# Patient Record
Sex: Male | Born: 1955 | Race: White | Hispanic: No | Marital: Single | Smoking: Former smoker
Health system: Southern US, Community
[De-identification: ages and names within clinical notes are randomized; demographics above are authoritative.]

## PROBLEM LIST (undated history)

## (undated) DIAGNOSIS — J939 Pneumothorax, unspecified: Secondary | ICD-10-CM

## (undated) DIAGNOSIS — S0990XA Unspecified injury of head, initial encounter: Secondary | ICD-10-CM

## (undated) DIAGNOSIS — R569 Unspecified convulsions: Secondary | ICD-10-CM

## (undated) DIAGNOSIS — I739 Peripheral vascular disease, unspecified: Secondary | ICD-10-CM

## (undated) DIAGNOSIS — E872 Acidosis, unspecified: Secondary | ICD-10-CM

## (undated) DIAGNOSIS — I2510 Atherosclerotic heart disease of native coronary artery without angina pectoris: Secondary | ICD-10-CM

## (undated) DIAGNOSIS — I6389 Other cerebral infarction: Secondary | ICD-10-CM

## (undated) DIAGNOSIS — H409 Unspecified glaucoma: Secondary | ICD-10-CM

## (undated) DIAGNOSIS — I10 Essential (primary) hypertension: Secondary | ICD-10-CM

## (undated) DIAGNOSIS — J449 Chronic obstructive pulmonary disease, unspecified: Secondary | ICD-10-CM

## (undated) DIAGNOSIS — E785 Hyperlipidemia, unspecified: Secondary | ICD-10-CM

## (undated) HISTORY — PX: CARDIAC CATHETERIZATION: SHX172

## (undated) HISTORY — PX: OTHER SURGICAL HISTORY: SHX169

## (undated) HISTORY — PX: COLONOSCOPY: SHX174

## (undated) HISTORY — PX: BRAIN SURGERY: SHX531

---
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
## 2002-08-07 ENCOUNTER — Emergency Department (HOSPITAL_COMMUNITY): Admission: EM | Admit: 2002-08-07 | Discharge: 2002-08-07 | Payer: Self-pay | Admitting: Emergency Medicine

## 2005-10-17 ENCOUNTER — Ambulatory Visit: Payer: Self-pay | Admitting: Internal Medicine

## 2005-10-19 ENCOUNTER — Emergency Department: Payer: Self-pay | Admitting: General Practice

## 2009-02-08 ENCOUNTER — Ambulatory Visit: Payer: Self-pay | Admitting: Gastroenterology

## 2013-04-28 ENCOUNTER — Ambulatory Visit: Payer: Self-pay | Admitting: Gastroenterology

## 2013-04-28 DIAGNOSIS — R9431 Abnormal electrocardiogram [ECG] [EKG]: Secondary | ICD-10-CM

## 2013-04-30 LAB — PATHOLOGY REPORT

## 2013-05-29 ENCOUNTER — Ambulatory Visit: Payer: Self-pay | Admitting: Internal Medicine

## 2013-06-10 ENCOUNTER — Ambulatory Visit: Payer: Self-pay | Admitting: Internal Medicine

## 2013-10-20 ENCOUNTER — Ambulatory Visit: Payer: Self-pay | Admitting: Vascular Surgery

## 2013-10-20 LAB — CREATININE, SERUM
CREATININE: 0.92 mg/dL (ref 0.60–1.30)
EGFR (Non-African Amer.): >60

## 2013-10-20 LAB — BUN: BUN: 19 mg/dL — ABNORMAL HIGH (ref 7–18)

## 2014-11-27 NOTE — Op Note (Signed)
PATIENT NAME:  Xavier Conley, Xavier Conley MR#:  [id number] DATE OF BIRTH:  1956/01/02  DATE OF PROCEDURE:  10/20/2013  PREOPERATIVE DIAGNOSIS: Atherosclerotic occlusive disease bilateral lower extremities, with rest pain in the left lower extremity.   POSTOPERATIVE DIAGNOSIS: Atherosclerotic occlusive disease bilateral lower extremities, with rest pain in the left lower extremity.   PROCEDURES PERFORMED: 1. Abdominal aortogram.  2. Left lower extremity distal runoff.  3. Percutaneous transluminal angioplasty and stent placement, left common iliac artery.  4. Percutaneous transluminal angioplasty and stent placement, right common iliac artery.   SURGEON: Katha Cabal, M.D.   SEDATION: Versed 4 mg plus fentanyl 150 mcg administered IV. Continuous ECG, pulse oximetry and cardiopulmonary monitoring is performed throughout the entire procedure by the interventional radiology nurse. Total sedation time was one hour.   ACCESS:  1. A 6 French sheath retrograde direction in right common femoral artery.  2. A 6 French sheath retrograde direction, left common femoral artery.   FLUOROSCOPY TIME: 3.1 minutes.   CONTRAST USED: Isovue 95 mL.   INDICATIONS: Xavier Conley is a 59 year old gentleman who presents to the office with worsening pain in his left lower extremity. Physical examination as well as noninvasive studies demonstrated profound atherosclerotic occlusive disease, and he is, therefore, undergoing angiography with the hope for intervention. The risks and benefits were reviewed. All questions answered. The patient agrees to proceed.   DESCRIPTION OF PROCEDURE: The patient is taken to special procedures and placed in the supine position. After adequate sedation is achieved, both groins are prepped and draped in sterile fashion. Appropriate timeout is called.   Ultrasound is placed in a sterile sleeve. Common femoral artery on the right is identified. It is echolucent and pulsatile indicating  patency. Image is recorded for the permanent record. There does appear to be moderate plaque burden in the posterior aspect of the artery distally and the ultrasound is used to scan more proximally to select a more suitable access site. Image is recorded and the puncture is made under direct visualization. Microwire followed by microsheath, J-wire followed by a 5 French sheath a 5 French pigtail catheter. The pigtail catheter is positioned at the level of T12 and AP projection of the aorta is obtained. Pigtail catheter is repositioned to above the bifurcation and LAO projection of the pelvis is obtained. Subtotal occlusion is noted in the left common iliac, a greater than 80% occlusion is noted in the right common iliac, 4000 units of heparin is given. Ultrasound is returned to this field.   The left common femoral artery is then identified, it is echolucent and minimally pulsatile indicating it is patent, but this is also known from the angiography that has already been performed in the proximal aspect of the common femoral. The micropuncture needle is used to access the artery under direct visualization. Microwire followed by microsheath, J-wire followed by a 6 French sheath. Magic torque wire is then negotiated through the iliac lesion and into the aorta. The pigtail catheter is then used to advance a Magic torque wire up to the right side and the 5 [place] sheath is exchanged for a 6 [place] sheath.   Initially, a 7 x 39 Omnilink stent is selected, and advanced up the left side. A 7 x 40 balloon is advanced up the right, and using the kissing technique simultaneous inflation is performed. Subsequently, reinjection by hand on the right more distally in the common iliac there is that previously mentioned stenosis and this is treated with a 7 x  29 Omnilink stent.   Injection of contrast is then used to demonstrate that the distal aorta, bilateral common iliacs and external iliacs are now widely patent. There is  good apposition of the stent on the left than the right.   Using the sheath on the left hand injection of contrast is then used to demonstrate distal runoff.   Oblique views of first the left and the right groin are obtained and subsequently a StarClose devices are deployed successfully.   INTERPRETATION: The abdominal aorta is opacified with a bolus injection of contrast. There are no hemodynamically significant stenoses. The origins of the common iliac arteries are patent bilaterally, however within several millimeters significant disease is noted and there is a subtotal occlusion in the midportion of the common iliac on the left, there is an 80% stenosis in the distal portion on the right. External iliacs appear patent. Internal iliac arteries are patent, but quite small at their origins with moderate stenosis.   The left common femoral extending into the proximal SFA for at least 3 to 4 cm demonstrates coral reef-type plaque formation with a very high-grade stenosis. In the oblique view the profunda appears to be relatively spared. Distal to this lesion in the common and proximal SFA. The SFA is widely patent without evidence of hemodynamically significant stenoses. Hunter's canal is widely patent is as the above-knee popliteal and there is three-vessel runoff to the foot with the dominant being the posterior tibial.   IMPRESSION:  1. Successful angioplasty and stent placement as described above bilateral common iliac arteries.  2. Severe calcific disease within the common femoral extending into the SFA.  3. There is patency of the mid and distal SFA, as well as three-vessel runoff on the left.   ____________________________ Katha Cabal, MD ggs:sg D: 10/20/2013 11:28:46 ET T: 10/20/2013 12:47:30 ET JOB#: [id number]  cc: Katha Cabal, MD, <Dictator> Ocie Cornfield. Ouida Sills, [place] MD ELECTRONICALLY SIGNED 10/27/2013 18:48

## 2015-06-07 ENCOUNTER — Emergency Department
Admission: EM | Admit: 2015-06-07 | Discharge: 2015-06-07 | Disposition: A | Discharge status: Other Acute Inpatient Hospital | Payer: Medicare Other | Attending: Emergency Medicine | Admitting: Emergency Medicine

## 2015-06-07 ENCOUNTER — Inpatient Hospital Stay (HOSPITAL_COMMUNITY)
Admission: AD | Admit: 2015-06-07 | Discharge: 2015-06-08 | DRG: 641 | Disposition: A | Discharge status: Home / Self Care | Payer: Medicare Other | Source: Other Acute Inpatient Hospital | Attending: Neurology | Admitting: Neurology

## 2015-06-07 ENCOUNTER — Emergency Department: Payer: Medicare Other

## 2015-06-07 ENCOUNTER — Encounter: Payer: Self-pay | Admitting: Emergency Medicine

## 2015-06-07 DIAGNOSIS — I10 Essential (primary) hypertension: Secondary | ICD-10-CM | POA: Insufficient documentation

## 2015-06-07 DIAGNOSIS — Z87820 Personal history of traumatic brain injury: Secondary | ICD-10-CM

## 2015-06-07 DIAGNOSIS — Z7982 Long term (current) use of aspirin: Secondary | ICD-10-CM

## 2015-06-07 DIAGNOSIS — R269 Unspecified abnormalities of gait and mobility: Secondary | ICD-10-CM | POA: Diagnosis present

## 2015-06-07 DIAGNOSIS — E860 Dehydration: Principal | ICD-10-CM | POA: Diagnosis present

## 2015-06-07 DIAGNOSIS — R561 Post traumatic seizures: Secondary | ICD-10-CM | POA: Diagnosis not present

## 2015-06-07 DIAGNOSIS — E669 Obesity, unspecified: Secondary | ICD-10-CM | POA: Diagnosis present

## 2015-06-07 DIAGNOSIS — I619 Nontraumatic intracerebral hemorrhage, unspecified: Secondary | ICD-10-CM | POA: Insufficient documentation

## 2015-06-07 DIAGNOSIS — E785 Hyperlipidemia, unspecified: Secondary | ICD-10-CM | POA: Diagnosis present

## 2015-06-07 DIAGNOSIS — I629 Nontraumatic intracranial hemorrhage, unspecified: Secondary | ICD-10-CM

## 2015-06-07 DIAGNOSIS — Z79899 Other long term (current) drug therapy: Secondary | ICD-10-CM | POA: Diagnosis not present

## 2015-06-07 DIAGNOSIS — R270 Ataxia, unspecified: Secondary | ICD-10-CM | POA: Diagnosis not present

## 2015-06-07 DIAGNOSIS — F17200 Nicotine dependence, unspecified, uncomplicated: Secondary | ICD-10-CM | POA: Diagnosis present

## 2015-06-07 DIAGNOSIS — R42 Dizziness and giddiness: Secondary | ICD-10-CM | POA: Diagnosis present

## 2015-06-07 DIAGNOSIS — G40909 Epilepsy, unspecified, not intractable, without status epilepticus: Secondary | ICD-10-CM | POA: Diagnosis present

## 2015-06-07 DIAGNOSIS — I69398 Other sequelae of cerebral infarction: Secondary | ICD-10-CM | POA: Diagnosis not present

## 2015-06-07 DIAGNOSIS — Z6826 Body mass index (BMI) 26.0-26.9, adult: Secondary | ICD-10-CM | POA: Diagnosis not present

## 2015-06-07 DIAGNOSIS — M79602 Pain in left arm: Secondary | ICD-10-CM | POA: Diagnosis not present

## 2015-06-07 DIAGNOSIS — I611 Nontraumatic intracerebral hemorrhage in hemisphere, cortical: Secondary | ICD-10-CM | POA: Diagnosis not present

## 2015-06-07 DIAGNOSIS — E119 Type 2 diabetes mellitus without complications: Secondary | ICD-10-CM | POA: Diagnosis not present

## 2015-06-07 HISTORY — DX: Unspecified convulsions: R56.9

## 2015-06-07 HISTORY — DX: Essential (primary) hypertension: I10

## 2015-06-07 LAB — URINALYSIS COMPLETE WITH MICROSCOPIC (ARMC ONLY)
BILIRUBIN URINE: NEGATIVE
GLUCOSE, UA: NEGATIVE mg/dL
HGB URINE DIPSTICK: NEGATIVE
Ketones, ur: NEGATIVE mg/dL
LEUKOCYTES UA: NEGATIVE
Nitrite: NEGATIVE
Protein, ur: NEGATIVE mg/dL
SPECIFIC GRAVITY, URINE: 1.009 (ref 1.005–1.030)
Squamous Epithelial / LPF: NONE SEEN
WBC, UA: NONE SEEN WBC/hpf (ref 0–5)
pH: 8 (ref 5.0–8.0)

## 2015-06-07 LAB — BASIC METABOLIC PANEL
Anion gap: 6 (ref 5–15)
BUN: 10 mg/dL (ref 6–20)
CALCIUM: 8.7 mg/dL — AB (ref 8.9–10.3)
CO2: 27 mmol/L (ref 22–32)
Chloride: 105 mmol/L (ref 101–111)
Creatinine, Ser: 0.8 mg/dL (ref 0.61–1.24)
GFR calc non Af Amer: >60 mL/min (ref >60)
Glucose, Bld: 95 mg/dL (ref 65–99)
Potassium: 3.9 mmol/L (ref 3.5–5.1)
Sodium: 138 mmol/L (ref 135–145)

## 2015-06-07 LAB — CBC
HCT: 39.3 % — ABNORMAL LOW (ref 40.0–52.0)
Hemoglobin: 13.4 g/dL (ref 13.0–18.0)
MCH: 31.3 pg (ref 26.0–34.0)
MCHC: 34.2 g/dL (ref 32.0–36.0)
MCV: 91.8 fL (ref 80.0–100.0)
PLATELETS: 189 10*3/uL (ref 150–440)
RBC: 4.28 MIL/uL — AB (ref 4.40–5.90)
RDW: 15.2 % — AB (ref 11.5–14.5)
WBC: 5.8 10*3/uL (ref 3.8–10.6)

## 2015-06-07 LAB — TROPONIN I: TROPONIN I: 0.03 ng/mL (ref <0.031)

## 2015-06-07 LAB — CARBAMAZEPINE LEVEL, TOTAL: CARBAMAZEPINE LVL: 10.6 ug/mL (ref 4.0–12.0)

## 2015-06-07 MED ORDER — SODIUM CHLORIDE 0.9 % IV SOLN
1000.0000 mL | Freq: Once | INTRAVENOUS | Status: AC
  Administered 2015-06-07: 1000 mL via INTRAVENOUS

## 2015-06-07 MED ORDER — ACETAMINOPHEN 650 MG RE SUPP: 650.0000 mg | RECTAL | Status: DC | PRN

## 2015-06-07 MED ORDER — ACETAMINOPHEN 325 MG PO TABS: 650.0000 mg | ORAL_TABLET | ORAL | Status: DC | PRN

## 2015-06-07 MED ORDER — LABETALOL HCL 5 MG/ML IV SOLN
10.0000 mg | INTRAVENOUS | Status: DC | PRN
  Administered 2015-06-08: 20 mg via INTRAVENOUS
  Filled 2015-06-07: qty 4

## 2015-06-07 MED ORDER — PANTOPRAZOLE SODIUM 40 MG IV SOLR
40.0000 mg | Freq: Every day | INTRAVENOUS | Status: DC
  Administered 2015-06-08: 40 mg via INTRAVENOUS
  Filled 2015-06-07: qty 40

## 2015-06-07 MED ORDER — STROKE: EARLY STAGES OF RECOVERY BOOK
Freq: Once | Status: AC
  Administered 2015-06-08: 03:00:00
  Filled 2015-06-07: qty 1

## 2015-06-07 MED ORDER — SENNOSIDES-DOCUSATE SODIUM 8.6-50 MG PO TABS
1.0000 | ORAL_TABLET | Freq: Two times a day (BID) | ORAL | Status: DC
  Filled 2015-06-07: qty 1

## 2015-06-07 NOTE — ED Notes (Signed)
Lab called at this time to add on troponin and carbamazepine. Lab will add on.

## 2015-06-07 NOTE — ED Notes (Signed)
Patient transported to CT 

## 2015-06-07 NOTE — ED Notes (Signed)
Pt to ed with c/o left arm pain x several weeks and acute onset of dizziness that started today.  Pt denies sob, denies chest pain.

## 2015-06-07 NOTE — H&P (Addendum)
Stroke Consult Consulting Physician: Dr Jimmye Norman ED [facility]  Chief Complaint: ICH  HPI: Xavier Conley is an 59 y.o. male hx of HTN, TBI, seizures presenting to [facility] with acute onset of balance disturbance. Describes sensation of dizziness and light headed sensation upon standing. Reports feeling like he is falling to the left. Denies vertigo. Notes some improvement with sitting down. Denies any weakness, sensory, speech or visual deficits. No prior CVA or TIA history. Notes symptoms improved after receiving IV hydration in the ED. Tegretol level of 10 in the ED.   CT head imaging reviewed, shows encephalomalacia in left temporal lobe along with question of acute hemorrhage along the posterior periphery of the left temporal lobe and inferior most aspect of the left occipital lobe.   Date last known well: 11/01 Time last known well: 1800 tPA Given: no, ICH Modified Rankin: Rankin Score=0  ICH score of 0  Past Medical History  Diagnosis Date  . Hypertension   . Seizures ([place])     No past surgical history on file.  No family history on file. Social History:  reports that he has been smoking.  He does not have any smokeless tobacco history on file. He reports that he drinks alcohol. He reports that he does not use illicit drugs.  Allergies: No Known Allergies  Medications Prior to Admission  Medication Sig Dispense Refill  . acetaZOLAMIDE (DIAMOX) 250 MG tablet Take 250 mg by mouth 2 (two) times daily.    Marland Kitchen albuterol (PROVENTIL HFA;VENTOLIN HFA) 108 (90 BASE) MCG/ACT inhaler Inhale 2 puffs into the lungs every 4 (four) hours as needed for wheezing or shortness of breath.    Marland Kitchen amLODipine (NORVASC) 5 MG tablet Take 5 mg by mouth daily.    Marland Kitchen aspirin EC 81 MG tablet Take 81 mg by mouth daily.    Marland Kitchen atorvastatin (LIPITOR) 20 MG tablet Take 20 mg by mouth daily.     . carbamazepine (TEGRETOL XR) 200 MG 12 hr tablet Take 200 mg by mouth 2 (two) times daily. Pt takes with a 400mg  tablet.    .  carbamazepine (TEGRETOL XR) 400 MG 12 hr tablet Take 400 mg by mouth 2 (two) times daily. Pt takes with a 200mg  tablet.    . clopidogrel (PLAVIX) 75 MG tablet Take 75 mg by mouth daily.    . isosorbide mononitrate (IMDUR) 30 MG 24 hr tablet Take 30 mg by mouth daily.    Marland Kitchen losartan (COZAAR) 100 MG tablet Take 100 mg by mouth daily.      ROS: Out of a complete 14 system review, the patient complains of only the following symptoms, and all other reviewed systems are negative. +dizziness  Physical Examination: Filed Vitals:   06/07/15 2300  BP: 150/86  Pulse: 67  Temp:   Resp: 16   Physical Exam  Constitutional: He appears well-developed and well-nourished.  Psych: Affect appropriate to situation Eyes: No scleral injection HENT: No OP obstrucion Head: Normocephalic.  Cardiovascular: Normal rate and regular rhythm.  Respiratory: Effort normal and breath sounds normal.  GI: Soft. Bowel sounds are normal. No distension. There is no tenderness.  Skin: WDI  Neurologic Examination: Mental Status: Alert, oriented, thought content appropriate.  Speech fluent without evidence of aphasia.  Able to follow 3 step commands without difficulty. Cranial Nerves: II: funduscopic exam wnl bilaterally, right inferior VF deficit, pupils equal, round, reactive to light and accommodation III,IV, VI: ptosis not present, extra-ocular motions intact bilaterally V,VII: smile symmetric, facial light touch  sensation normal bilaterally VIII: hearing normal bilaterally IX,X: gag reflex present XI: trapezius strength/neck flexion strength normal bilaterally XII: tongue strength normal  Motor: Right : Upper extremity    Left:     Upper extremity 5/5 deltoid       5/5 deltoid 5/5 biceps      5/5 biceps  5/5 triceps      5/5 triceps 5/5 hand grip      5/5 hand grip  Lower extremity     Lower extremity 5/5 hip flexor      5/5 hip flexor 5/5 quadricep      5/5 quadriceps  5/5 hamstrings     5/5  hamstrings 5/5 plantar flexion       5/5 plantar flexion 5/5 plantar extension     5/5 plantar extension Tone and bulk:normal tone throughout; no atrophy noted Sensory: Pinprick and light touch intact throughout, bilaterally Deep Tendon Reflexes: 2+ and symmetric throughout Plantars: Right: downgoing   Left: downgoing Cerebellar: normal finger-to-nose, and normal heel-to-shin test Gait: deferred  Laboratory Studies:   Basic Metabolic Panel:  Recent Labs Lab 06/07/15 1812  NA 138  K 3.9  CL 105  CO2 27  GLUCOSE 95  BUN 10  CREATININE 0.80  CALCIUM 8.7*    Liver Function Tests: No results for input(s): AST, ALT, ALKPHOS, BILITOT, PROT, ALBUMIN in the last 168 hours. No results for input(s): LIPASE, AMYLASE in the last 168 hours. No results for input(s): AMMONIA in the last 168 hours.  CBC:  Recent Labs Lab 06/07/15 1812  WBC 5.8  HGB 13.4  HCT 39.3*  MCV 91.8  PLT 189    Cardiac Enzymes:  Recent Labs Lab 06/07/15 1812  TROPONINI 0.03    BNP: Invalid input(s): POCBNP  CBG: No results for input(s): GLUCAP in the last 168 hours.  Microbiology: No results found for this or any previous visit.  Coagulation Studies: No results for input(s): LABPROT, INR in the last 72 hours.  Urinalysis:  Recent Labs Lab 06/07/15 1812  COLORURINE YELLOW*  LABSPEC 1.009  PHURINE 8.0  GLUCOSEU NEGATIVE  HGBUR NEGATIVE  BILIRUBINUR NEGATIVE  KETONESUR NEGATIVE  PROTEINUR NEGATIVE  NITRITE NEGATIVE  LEUKOCYTESUR NEGATIVE    Lipid Panel:  No results found for: CHOL, TRIG, HDL, CHOLHDL, VLDL, LDLCALC  HgbA1C: No results found for: HGBA1C  Urine Drug Screen:  No results found for: LABOPIA, COCAINSCRNUR, LABBENZ, AMPHETMU, THCU, LABBARB  Alcohol Level: No results for input(s): ETH in the last 168 hours.  Other results:  Imaging: Ct Head Wo Contrast  06/07/2015  CLINICAL DATA:  Acute onset dizziness EXAM: CT HEAD WITHOUT CONTRAST TECHNIQUE: Contiguous axial  images were obtained from the base of the skull through the vertex without intravenous contrast. COMPARISON:  Brain MRI October 17, 2005 FINDINGS: There is mild diffuse atrophy. Enlargement of the atrium the left lateral ventricle is due to encephalomalacia. There is evidence of a prior infarct in the left temporal lobe. Currently there is hemorrhage along the periphery of the posterior left temporal lobe and inferior most aspect of the left occipital lobe extending to the level of the tentorium. No other hemorrhage is seen. There is no mass, midline shift, or extra-axial fluid. There is mild patchy periventricular small vessel disease in the centra semiovale bilaterally. There is a posttraumatic bony defect in the left temporal lobe, stable. Bony calvarium otherwise appears intact. The mastoid air cells are clear. There is extensive debris in each external auditory canal. 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 Enlargement of the atrium of the left lateral ventricle is due to this encephalomalacia. There is evidence of acute hemorrhage along the posterior periphery of the left temporal lobe and inferior most aspect of the left occipital lobe. This hemorrhage may be due to infarction in this area but also could represent focal amyloid angiopathy. Elsewhere there is atrophy with mild periventricular small vessel disease. No midline shift. No subdural or epidural fluid collections. There is probable cerumen in both external auditory canals. Critical Value/emergent results were called by telephone at the time of interpretation on 06/07/2015 at 7:20 pm to Dr. Lenise Arena , who verbally acknowledged these results. Electronically Signed   By: Lowella Grip III M.D.   On: 06/07/2015 19:20    Assessment: 59 y.o. male hx of seizures, TBI, HTN presenting to [facility] ED with acute onset of gait instability described as a dizzy/light-headed sensation upon standing. CT head in ED shows question  of small ICH in addition to prior left temporal encephalomalacia. ED request transfer to [facility] for further evaluation. Of note, patient reports his symptoms improved after receiving IV hydration, question presenting symptoms related to orthostatic changes.    Plan: 1) Admit to ICU. Likely transfer out in the morning 2)MRI brain without contrast 3) no antiplatelets or anticoagulants 4) blood pressure control with goal systolic <924 5) Frequent neuro checks 6) If symptoms worsen or there is decreased mental status, repeat stat head CT    This patient is critically ill and at significant risk of neurological worsening, death and care requires constant monitoring of vital signs, hemodynamics,respiratory and cardiac monitoring,review of multiple databases, neurological assessment, discussion with family, other specialists and medical decision making of high complexity. I spent 35 inutes of neurocritical care time in the care of this patient.    Jim Like, DO [facility] [phone]  If 7pm- 7am, please page neurology on call as listed in [place]. 06/07/2015, 11:30 PM

## 2015-06-07 NOTE — ED Notes (Signed)
MD at bedside. 

## 2015-06-07 NOTE — ED Notes (Signed)
Pt given ice chips per verbal okay from MD Jimmye Norman, stroke swallow screen completed and passed. Pt sitting up in bed, eating ice chips, tolerating well, no acute distress noted. Pt and family verbalized no further needs at this time

## 2015-06-07 NOTE — ED Provider Notes (Signed)
[facility] Emergency Department Provider Note     Time seen: ----------------------------------------- 6:42 PM on 06/07/2015 -----------------------------------------    I have reviewed the triage vital signs and the nursing notes.   HISTORY  Chief Complaint Dizziness    HPI Xavier RIESGO is a 59 y.o. male who presents with acute onset balance disturbance around an hour and a half ago. Patient states she's had some left arm pain for several weeks but today get up and watching TV and was falling to his left. He describes this as dizziness but did not have room spinning sensation, denies any fevers, chills, chest pain or shortness of breath. Doesn't history of seizures and has been taking his medications.   Past Medical History  Diagnosis Date  . Hypertension   . Seizures ([place])     There are no active problems to display for this patient.   History reviewed. No pertinent past surgical history.  Allergies Review of patient's allergies indicates no known allergies.  Social History Social History  Substance Use Topics  . Smoking status: Current Every Day Smoker  . Smokeless tobacco: None  . Alcohol Use: Yes    Review of Systems Constitutional: Negative for fever. Eyes: Negative for visual changes. ENT: Negative for sore throat. Cardiovascular: Negative for chest pain. Respiratory: Negative for shortness of breath. Gastrointestinal: Negative for abdominal pain, vomiting and diarrhea. Genitourinary: Negative for dysuria. Musculoskeletal: Positive for left arm pain Skin: Negative for rash. Neurological: Negative for headaches, positive for balance disturbance  10-point ROS otherwise negative.  ____________________________________________   PHYSICAL EXAM:  VITAL SIGNS: ED Triage Vitals  Enc Vitals Group     BP 06/07/15 1808 159/79 mmHg     Pulse Rate 06/07/15 1808 65     Resp 06/07/15 1808 20     Temp 06/07/15 1808 97.9 F  (36.6 C)     Temp Source 06/07/15 1808 Oral     SpO2 06/07/15 1808 97 %     Weight 06/07/15 1808 170 lb (77.111 kg)     Height 06/07/15 1808 5\' 8"  (1.727 m)     Head Cir --      Peak Flow --      Pain Score 06/07/15 1809 4     Pain Loc --      Pain Edu? --      Excl. in [place]? --     Constitutional: Alert and oriented. Well appearing and in no distress. Eyes: Conjunctivae are normal. PERRL. Normal extraocular movements. ENT   Head: Normocephalic and atraumatic.   Nose: No congestion/rhinnorhea.   Mouth/Throat: Mucous membranes are moist.   Neck: No stridor. Cardiovascular: Normal rate, regular rhythm. Normal and symmetric distal pulses are present in all extremities. No murmurs, rubs, or gallops. Respiratory: Normal respiratory effort without tachypnea nor retractions. Breath sounds are clear and equal bilaterally. No wheezes/rales/rhonchi. Gastrointestinal: Soft and nontender. No distention. No abdominal bruits.  Musculoskeletal: Nontender with normal range of motion in all extremities. No joint effusions.  No lower extremity tenderness nor edema. Neurologic:  Normal speech and language. Patient with normal strength and sensation. Finger to nose is slightly imprecise. I do not appreciate balance disturbance when I walked in. Negative Romberg examination. Skin:  Skin is warm, dry and intact. No rash noted. Psychiatric: Mood and affect are normal. Speech and behavior are normal. Patient exhibits appropriate insight and judgment. ____________________________________________  EKG: Interpreted by me. Normal sinus rhythm with a rate of 65 bpm, LVH with repolarization abnormality, normal  PR interval, wide QRS, normal QT interval.  ____________________________________________  ED COURSE:  Pertinent labs & imaging results that were available during my care of the patient were reviewed by me and considered in my medical decision making (see chart for details). Patient with acute  onset ataxia. We'll obtain head CT and basic labs. ____________________________________________    LABS (pertinent positives/negatives)  Labs Reviewed  BASIC METABOLIC PANEL - Abnormal; Notable for the following:    Calcium 8.7 (*)    All other components within normal limits  CBC - Abnormal; Notable for the following:    RBC 4.28 (*)    HCT 39.3 (*)    RDW 15.2 (*)    All other components within normal limits  URINALYSIS COMPLETEWITH MICROSCOPIC ([facility] ONLY) - Abnormal; Notable for the following:    Color, Urine YELLOW (*)    APPearance CLOUDY (*)    Bacteria, UA RARE (*)    All other components within normal limits  TROPONIN I   CRITICAL CARE Performed by: Earleen Newport   Total critical care time: 30 minutes  Critical care time was exclusive of separately billable procedures and treating other patients.  Critical care was necessary to treat or prevent imminent or life-threatening deterioration.  Critical care was time spent personally by me on the following activities: development of treatment plan with patient and/or surrogate as well as nursing, discussions with consultants, evaluation of patient's response to treatment, examination of patient, obtaining history from patient or surrogate, ordering and performing treatments and interventions, ordering and review of laboratory studies, ordering and review of radiographic studies, pulse oximetry and re-evaluation of patient's condition.   RADIOLOGY Images were viewed by me  CT head IMPRESSION: Encephalomalacia with prior infarct left temporal lobe. Enlargement of the atrium of the left lateral ventricle is due to this encephalomalacia. There is evidence of acute hemorrhage along the posterior periphery of the left temporal lobe and inferior most aspect of the left occipital lobe. This hemorrhage may be due to infarction in this area but also could represent focal amyloid angiopathy. Elsewhere there is atrophy  with mild periventricular small vessel disease. No midline shift. No subdural or epidural fluid collections.  There is probable cerumen in both external auditory canals.  Critical Value/emergent results were called by telephone at the time of interpretation on 06/07/2015 at 7:20 pm to Dr. Lenise Arena , who verbally acknowledged these results. ____________________________________________  FINAL ASSESSMENT AND PLAN  Ataxia, acute posterior intracranial hemorrhage  Plan: Patient with labs and imaging as dictated above. Patient is in no acute distress, CT reveals acute posterior hemorrhage. Will discuss with the neuro hospitalist at [facility] and prepare for transfer.  Patient's been accepted and transferred to [facility]. Accepting doctor is Dr. Janann Colonel. He is currently medically stable. Earleen Newport, MD   Earleen Newport, MD 06/07/15 [phone]

## 2015-06-08 ENCOUNTER — Inpatient Hospital Stay (HOSPITAL_COMMUNITY): Payer: Medicare Other

## 2015-06-08 ENCOUNTER — Encounter (HOSPITAL_COMMUNITY): Payer: Self-pay | Admitting: *Deleted

## 2015-06-08 DIAGNOSIS — I69398 Other sequelae of cerebral infarction: Secondary | ICD-10-CM

## 2015-06-08 DIAGNOSIS — R561 Post traumatic seizures: Secondary | ICD-10-CM

## 2015-06-08 LAB — MRSA PCR SCREENING: MRSA BY PCR: NEGATIVE

## 2015-06-08 MED ORDER — ALBUTEROL SULFATE (2.5 MG/3ML) 0.083% IN NEBU: 3.0000 mL | INHALATION_SOLUTION | RESPIRATORY_TRACT | Status: DC | PRN

## 2015-06-08 MED ORDER — ISOSORBIDE MONONITRATE ER 30 MG PO TB24
30.0000 mg | ORAL_TABLET | Freq: Every day | ORAL | Status: DC
  Filled 2015-06-08: qty 1

## 2015-06-08 MED ORDER — ATORVASTATIN CALCIUM 20 MG PO TABS
20.0000 mg | ORAL_TABLET | Freq: Every day | ORAL | Status: DC
Start: 2015-06-08 — End: 2015-06-08

## 2015-06-08 MED ORDER — CARBAMAZEPINE ER 400 MG PO TB12: 400.0000 mg | ORAL_TABLET | Freq: Two times a day (BID) | ORAL | Status: DC

## 2015-06-08 MED ORDER — ASPIRIN EC 81 MG PO TBEC
81.0000 mg | DELAYED_RELEASE_TABLET | Freq: Every day | ORAL | Status: DC
Start: 2015-06-08 — End: 2015-06-08

## 2015-06-08 MED ORDER — AMLODIPINE BESYLATE 5 MG PO TABS: 5.0000 mg | ORAL_TABLET | Freq: Every day | ORAL | Status: DC

## 2015-06-08 MED ORDER — CLOPIDOGREL BISULFATE 75 MG PO TABS: 75.0000 mg | ORAL_TABLET | Freq: Every day | ORAL | Status: DC

## 2015-06-08 MED ORDER — LOSARTAN POTASSIUM 50 MG PO TABS: 100.0000 mg | ORAL_TABLET | Freq: Every day | ORAL | Status: DC

## 2015-06-08 MED ORDER — INFLUENZA VAC SPLIT QUAD 0.5 ML IM SUSY: 0.5000 mL | PREFILLED_SYRINGE | INTRAMUSCULAR | Status: DC

## 2015-06-08 MED ORDER — ACETAZOLAMIDE 250 MG PO TABS
250.0000 mg | ORAL_TABLET | Freq: Two times a day (BID) | ORAL | Status: DC
  Filled 2015-06-08: qty 1

## 2015-06-08 MED ORDER — CARBAMAZEPINE ER 200 MG PO TB12
600.0000 mg | ORAL_TABLET | Freq: Two times a day (BID) | ORAL | Status: DC
  Filled 2015-06-08: qty 3

## 2015-06-08 NOTE — Progress Notes (Signed)
PT Cancellation Note  Patient Details Name: Xavier Conley MRN: [id number] DOB: 08/19/1955   Cancelled Treatment:    Reason Eval/Treat Not Completed: Patient not medically ready.  Pt currently with active bedrest order.  Please advance activity order when appropriate for PT and mobility.     Pharell Rolfson, Thornton Papas 06/08/2015, 10:02 AM

## 2015-06-08 NOTE — Discharge Instructions (Signed)
Please follow up w/ Primary Care Physician on discharge within 1-2 weeks.

## 2015-06-08 NOTE — Evaluation (Signed)
Physical Therapy Evaluation Patient Details Name: Xavier Conley MRN: [id number] DOB: 10/23/1955 Today's Date: 06/08/2015   History of Present Illness  pt presents with Gait Difficulties, Dizziness, and balance difficulties.  pt with hx of TBI, HTN, and Seizures.    Clinical Impression  Pt seems to have returned to baseline level of function.  Pt able to ambulate and negotiate around and over obstacles without difficulties.  No further acute PT needs at this time, will sign off.      Follow Up Recommendations No PT follow up;Supervision - Intermittent    Equipment Recommendations  None recommended by PT    Recommendations for Other Services       Precautions / Restrictions Precautions Precautions: None Restrictions Weight Bearing Restrictions: No      Mobility  Bed Mobility Overal bed mobility: Modified Independent             General bed mobility comments: Increased time, but no deficits noted.    Transfers Overall transfer level: Modified independent Equipment used: None             General transfer comment: Use of UEs, but no A needed.    Ambulation/Gait Ambulation/Gait assistance: Independent Ambulation Distance (Feet): 200 Feet Assistive device: None Gait Pattern/deviations: WFL(Within Functional Limits)     General Gait Details: pt able to negotiate around and over obstacles without deficit.    Stairs            Wheelchair Mobility    Modified Rankin (Stroke Patients Only)       Balance Overall balance assessment: No apparent balance deficits (not formally assessed)                                           Pertinent Vitals/Pain Pain Assessment: No/denies pain    Home Living Family/patient expects to be discharged to:: Private residence Living Arrangements: Alone Available Help at Discharge: Family;Available 24 hours/day (Family to stay with pt initially.) Type of Home: House Home Access: Stairs to  enter Entrance Stairs-Rails: None Entrance Stairs-Number of Steps: 1 Home Layout: One level Home Equipment: None      Prior Function Level of Independence: Independent               Hand Dominance        Extremity/Trunk Assessment   Upper Extremity Assessment: Overall WFL for tasks assessed           Lower Extremity Assessment: Overall WFL for tasks assessed      Cervical / Trunk Assessment: Normal  Communication   Communication: No difficulties  Cognition Arousal/Alertness: Awake/alert Behavior During Therapy: WFL for tasks assessed/performed Overall Cognitive Status: History of cognitive impairments - at baseline                      General Comments      Exercises        Assessment/Plan    PT Assessment Patent does not need any further PT services  PT Diagnosis Difficulty walking   PT Problem List    PT Treatment Interventions     PT Goals (Current goals can be found in the Care Plan section) Acute Rehab PT Goals Patient Stated Goal: Home PT Goal Formulation: All assessment and education complete, DC therapy    Frequency     Barriers to discharge  Co-evaluation               End of Session Equipment Utilized During Treatment: Gait belt Activity Tolerance: Patient tolerated treatment well Patient left: in bed;with call bell/phone within reach Nurse Communication: Mobility status         Time: 1023-1040 PT Time Calculation (min) (ACUTE ONLY): 17 min   Charges:   PT Evaluation $Initial PT Evaluation Tier I: 1 Procedure     PT G CodesCatarina Conley, [place] 06/08/2015, 11:31 AM

## 2015-06-08 NOTE — Evaluation (Signed)
Speech Language Pathology Evaluation Patient Details Name: Xavier Conley MRN: [id number] DOB: Mar 06, 1956 Today's Date: 06/08/2015 Time: 2094-7096 SLP Time Calculation (min) (ACUTE ONLY): 16 min  Problem List:  Patient Active Problem List   Diagnosis Date Noted  . ICH (intracerebral hemorrhage) ([place]) 06/07/2015   Past Medical History:  Past Medical History  Diagnosis Date  . Hypertension   . Seizures ([place])    Past Surgical History: History reviewed. No pertinent past surgical history. HPI:  59 y.o. male hx of seizures, TBI, HTN presenting to [facility] ED with acute onset of gait instability described as a dizzy/light-headed sensation upon standing. CT head in ED shows question of small ICH in addition to prior left temporal encephalomalacia. ED request transfer to [facility] for further evaluation. Of note, patient reports his symptoms improved after receiving IV hydration, question presenting symptoms related to orthostatic changes.    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  He compensates well for word-retrieval deficits; describes difficulty with writing, but manages his home and finances independently.  No SLP f/u needed - will sign off.     SLP Assessment  Patient does not need any further Speech Lanaguage Pathology Services    Follow Up Recommendations  None       Pertinent Vitals/Pain Pain Assessment: No/denies pain   SLP Goals     SLP Evaluation Prior Functioning  Cognitive/Linguistic Baseline: Baseline deficits Baseline deficit details: aphasia Type of Home: House  Lives With: Alone Available Help at Discharge: Family;Available 24 hours/day   Cognition  Overall Cognitive Status: History of cognitive impairments - at baseline Arousal/Alertness: Awake/alert Orientation Level: Oriented X4 Attention: Sustained Sustained Attention: Appears  intact Awareness: Appears intact    Comprehension  Auditory Comprehension Overall Auditory Comprehension: Impaired at baseline Visual Recognition/Discrimination Discrimination: Exceptions to [facility]    Expression Expression Primary Mode of Expression: Verbal Verbal Expression Overall Verbal Expression: Impaired at baseline Initiation: No impairment Level of Generative/Spontaneous Verbalization: Conversation Repetition: No impairment Naming: Impairment Responsive: 26-50% accurate Confrontation: Impaired Convergent: 75-100% accurate Divergent: 0-24% accurate Verbal Errors: Semantic paraphasias Pragmatics: No impairment Written Expression Dominant Hand: Right Written Expression: Exceptions to [facility] Self Formulation Ability: Sentence;Word   Oral / Motor Oral Motor/Sensory Function Overall Oral Motor/Sensory Function: Appears within functional limits for tasks assessed Motor Speech Overall Motor Speech: Appears within functional limits for tasks assessed   Brenley Priore L. Tivis Ringer, [place] CCC/SLP Pager [phone]      Juan Quam Laurice 06/08/2015, 1:10 PM

## 2015-06-08 NOTE — Discharge Summary (Signed)
Stroke Discharge Summary  Patient ID: Xavier Conley   MRN: [id number]      DOB: [birth year]  Date of Admission: 06/07/2015 Date of Discharge: 06/09/2015  Attending Physician:  No att. providers found, Stroke MD  Consulting Physician(s):     None  Patient's PCP:  No primary care provider on file.  DISCHARGE DIAGNOSIS: Active Problems:   Dizziness 2/2 orthostatic hypotension. 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  Past Medical History  Diagnosis Date  . Hypertension   . Seizures ([place])    History reviewed. No pertinent past surgical history.    Medication List    TAKE these medications        acetaZOLAMIDE 250 MG tablet  Commonly known as:  DIAMOX  Take 250 mg by mouth 2 (two) times daily.     albuterol 108 (90 BASE) MCG/ACT inhaler  Commonly known as:  PROVENTIL HFA;VENTOLIN HFA  Inhale 2 puffs into the lungs every 4 (four) hours as needed for wheezing or shortness of breath.     amLODipine 5 MG tablet  Commonly known as:  NORVASC  Take 5 mg by mouth daily.     aspirin EC 81 MG tablet  Take 81 mg by mouth daily.     atorvastatin 20 MG tablet  Commonly known as:  LIPITOR  Take 20 mg by mouth daily.     carbamazepine 200 MG 12 hr tablet  Commonly known as:  TEGRETOL XR  Take 200 mg by mouth 2 (two) times daily. Pt takes with a 400mg  tablet.     carbamazepine 400 MG 12 hr tablet  Commonly known as:  TEGRETOL XR  Take 400 mg by mouth 2 (two) times daily. Pt takes with a 200mg  tablet.     clopidogrel 75 MG tablet  Commonly known as:  PLAVIX  Take 75 mg by mouth daily.     isosorbide mononitrate 30 MG 24 hr tablet  Commonly known as:  IMDUR  Take 30 mg by mouth daily.     losartan 100 MG tablet  Commonly known as:   COZAAR  Take 100 mg by mouth daily.        LABORATORY STUDIES CBC    Component Value Date/Time   WBC 5.8 06/07/2015 1812   RBC 4.28* 06/07/2015 1812   HGB 13.4 06/07/2015 1812   HCT 39.3* 06/07/2015 1812   PLT 189 06/07/2015 1812   MCV 91.8 06/07/2015 1812   MCH 31.3 06/07/2015 1812   MCHC 34.2 06/07/2015 1812   RDW 15.2* 06/07/2015 1812   CMP    Component Value Date/Time   NA 138 06/07/2015 1812   K 3.9 06/07/2015 1812   CL 105 06/07/2015 1812   CO2 27 06/07/2015 1812   GLUCOSE 95 06/07/2015 1812   BUN 10 06/07/2015 1812   BUN 19* 10/20/2013 0805   CREATININE 0.80 06/07/2015 1812   CREATININE 0.92 10/20/2013 0805   CALCIUM 8.7* 06/07/2015 1812   GFRNONAA >60 06/07/2015 1812   GFRNONAA >60 10/20/2013 0805   GFRAA >60 06/07/2015 1812   GFRAA >60 10/20/2013 0805   Cardiac Panel (last 3 results)   Recent Labs  06/07/15 1812  TROPONINI 0.03   Urinalysis    Component Value Date/Time  COLORURINE YELLOW* 06/07/2015 1812   APPEARANCEUR CLOUDY* 06/07/2015 1812   LABSPEC 1.009 06/07/2015 1812   PHURINE 8.0 06/07/2015 1812   GLUCOSEU NEGATIVE 06/07/2015 1812   HGBUR NEGATIVE 06/07/2015 1812   BILIRUBINUR NEGATIVE 06/07/2015 1812   KETONESUR NEGATIVE 06/07/2015 1812   PROTEINUR NEGATIVE 06/07/2015 1812   NITRITE NEGATIVE 06/07/2015 1812   LEUKOCYTESUR NEGATIVE 06/07/2015 1812    SIGNIFICANT DIAGNOSTIC STUDIES  CT head: Shows encephalomalacia in left temporal lobe along with question of acute hemorrhage along the posterior periphery of the left temporal lobe and inferior most aspect of the left occipital lobe.   MRI: No acute intracranial process, no MR findings of acute hemorrhage. Extensive LEFT temporal occipital parietal encephalomalacia. Mineralization LEFT temporal occipital lobes corresponding to CT finding.    HISTORY OF PRESENT ILLNESS Xavier Conley is an 59 y.o. male hx of HTN, TBI, seizures presenting to [facility] with acute onset of balance  disturbance. Describes sensation of dizziness and light headed sensation upon standing. Reports feeling like he is falling to the left. Denies vertigo. Notes some improvement with sitting down. Denies any weakness, sensory, speech or visual deficits. No prior CVA or TIA history. Notes symptoms improved after receiving IV hydration in the ED. Tegretol level of 10 in the ED.   HOSPITAL COURSE Patient w/ previous h/o TBI and encephalomalacia, presented to [facility] with acute onset of balance disturbance. He described the sensation as dizziness and lightheadedness upon standing and feeling like he was falling to the left. No weakness, sensory, speech, or visual deficits. No prior h/o CVA or TIA. Symptoms improved w/ IV hydration in ED. CT head in ED showed encephalomalacia w/ prior left temporal lobe infarct. Also suggestive of a questionable acute hemorrhage along posterior periphery of left temporal lobe and inferior aspect of the occipital lobe. No midline shift. Confirmatory MRI showed no hemorrhage or acute abnormality.    DISCHARGE EXAM Blood pressure 162/83, pulse 75, temperature 97.5 F (36.4 C), temperature source Oral, resp. rate 19, height 5\' 8"  (1.727 m), weight 172 lb 13.5 oz (78.4 kg), SpO2 98 %.   General: Obese white male, alert, cooperative, NAD. HEENT: PERRL, EOMI. Moist mucus membranes Neck: Full range of motion without pain, supple, no lymphadenopathy or carotid bruits Lungs: Clear to ascultation bilaterally, normal work of respiration, no wheezes, rales, rhonchi Heart: RRR, no murmurs, gallops, or rubs Abdomen: Soft, non-tender, non-distended, BS + Extremities: No cyanosis, clubbing, or edema  Mental Status -  Level of arousal and orientation to time, place, and person were intact. Some slurring of speech, word finding difficulties, mild short term memory impairment, and cognitive dysfunction, however this is chronic in nature, no acute findings.  Attention span and concentration  were normal. Fund of Knowledge mildly impaired.   Cranial Nerves II - XII - II - Visual field w/ some right sided peripheral vision defect.  III, IV, VI - Extraocular movements intact. V - Facial sensation intact bilaterally. VII - Facial movement intact bilaterally. VIII - Hearing & vestibular intact bilaterally. X - Palate elevates symmetrically. XI - Chin turning & shoulder shrug intact bilaterally. XII - Tongue protrusion intact.  Motor Strength - The patient's strength was normal in all extremities and pronator drift was absent. Bulk was normal and fasciculations were absent.  Motor Tone - Muscle tone was assessed at the neck and appendages and was normal.  Reflexes - The patient's reflexes were 1+ in all extremities and he had no pathological reflexes.  Sensory - Light touch, temperature/pinprick,  vibration and proprioception, and Romberg testing were assessed and were symmetrical.   Coordination - The patient had normal movements in the hands and feet with no ataxia or dysmetria. Tremor was absent.  Gait and Station - The patient's transfers, posture, gait, station, and turns were observed as normal.  Discharge Diet   Regular  DISCHARGE PLAN  Disposition:  Discharge home   Continue home aspirin 81 mg daily and clopidogrel 75 mg daily for secondary stroke prevention.  Follow-up No primary care provider on file. in 2 weeks.  Follow-up with PCP in 1-2 weeks.   40 minutes were spent preparing discharge.    Natasha Bence, MD PGY-3, Internal Medicine Pager: [phone] I have personally examined this patient, reviewed notes, independently viewed imaging studies, participated in medical decision making and plan of care. I have made any additions or clarifications directly to the above note. Agree with note above.   Antony Contras, MD Medical Director [facility] Stroke [facility] Pager: [phone] 06/09/2015 4:35 PM

## 2015-06-08 NOTE — Progress Notes (Signed)
OT Cancellation Note  Patient Details Name: Xavier Conley MRN: [id number] DOB: 1956/04/03   Cancelled Treatment:    Reason Eval/Treat Not Completed: Patient not medically ready (bedrest order set)  Vonita Moss   OTR/L Pager: [phone] Office: [phone] .  06/08/2015, 6:53 AM

## 2015-06-08 NOTE — Progress Notes (Signed)
STROKE TEAM PROGRESS NOTE   HISTORY [name] is a 59 y.o. male w/ PMHx of HTN, traumatic brain injury, and seizures, presented to [facility] with acute onset of balance disturbance. He described the sensation as dizziness and lightheadedness upon standing and feeling like he was falling to the left. Notes improvement while sitting. No weakness, sensory, speech, or visual deficits. No prior h/o CVA or TIA. Symptoms improved w/ IV hydration in ED.   CT head in ED showed encephalomalacia w/ prior left temporal lobe infarct. Also suggestive of a questionable acute hemorrhage along posterior periphery of left temporal lobe and inferior aspect of the occipital lobe. No midline shift. Confirmatory MRI shows no hemorrhage or acute abnormality.    SUBJECTIVE (INTERVAL HISTORY) Sister at bedside. Patient doing well this AM, balance issues resolved with IVF yesterday, most likely orthostasis. Speech and  motor function at baseline.    OBJECTIVE Temp:  [97.7 F (36.5 C)-98.1 F (36.7 C)] 97.7 F (36.5 C) (11/02 0400) Pulse Rate:  [61-80] 80 (11/02 0500) Cardiac Rhythm:  [-]  Resp:  [11-24] 24 (11/02 0500) BP: (135-160)/(60-107) 139/73 mmHg (11/02 0500) SpO2:  [96 %-99 %] 96 % (11/02 0500) Weight:  [170 lb (77.111 kg)-172 lb 13.5 oz (78.4 kg)] 172 lb 13.5 oz (78.4 kg) (11/01 2238)  CBC:  Recent Labs Lab 06/07/15 1812  WBC 5.8  HGB 13.4  HCT 39.3*  MCV 91.8  PLT 595    Basic Metabolic Panel:  Recent Labs Lab 06/07/15 1812  NA 138  K 3.9  CL 105  CO2 27  GLUCOSE 95  BUN 10  CREATININE 0.80  CALCIUM 8.7*   IMAGING  Ct Head Wo Contrast  06/07/2015  CLINICAL DATA:  Acute onset dizziness EXAM: CT HEAD WITHOUT CONTRAST TECHNIQUE: Contiguous axial images were obtained from the base of the skull through the vertex without intravenous contrast. COMPARISON:  Brain MRI October 17, 2005 FINDINGS: There is mild diffuse atrophy. Enlargement of the atrium the left lateral ventricle is due  to encephalomalacia. There is evidence of a prior infarct in the left temporal lobe. Currently there is hemorrhage along the periphery of the posterior left temporal lobe and inferior most aspect of the left occipital lobe extending to the level of the tentorium. No other hemorrhage is seen. There is no mass, midline shift, or extra-axial fluid. There is mild patchy periventricular small vessel disease in the centra semiovale bilaterally. There is a posttraumatic bony defect in the left temporal lobe, stable. Bony calvarium otherwise appears intact. The mastoid air cells are clear. There is extensive debris in each external auditory canal. 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 Enlargement of the atrium of the left lateral ventricle is due to this encephalomalacia. There is evidence of acute hemorrhage along the posterior periphery of the left temporal lobe and inferior most aspect of the left occipital lobe. This hemorrhage may be due to infarction in this area but also could represent focal amyloid angiopathy. Elsewhere there is atrophy with mild periventricular small vessel disease. No midline shift. No subdural or epidural fluid collections. There is probable cerumen in both external auditory canals. Critical Value/emergent results were called by telephone at the time of interpretation on 06/07/2015 at 7:20 pm to Dr. Lenise Arena , who verbally acknowledged these results. Electronically Signed   By: Lowella Grip III M.D.   On: 06/07/2015 19:20   Mr Brain Wo Contrast  06/08/2015  CLINICAL DATA:  Follow-up acute LEFT temporal occipital lobe  hemorrhage. Acute onset balance disturbance. Dizziness. History of traumatic brain injury, hypertension and seizure seizures. EXAM: MRI HEAD WITHOUT CONTRAST TECHNIQUE: Multiplanar, multiecho pulse sequences of the brain and surrounding structures were obtained without intravenous contrast. COMPARISON:  CT head from [facility] June 07, 2015  at 1905 hours FINDINGS: No reduced diffusion to suggest acute ischemia. Faint cortical susceptibility artifact LEFT temporal occipital lobes. No susceptibility artifact to suggest lobar hematoma. Extensive LEFT temporal parietal occipital encephalomalacia.Ex vacuo dilatation LEFT lateral ventricle and atrium. No hydrocephalus. Old small LEFT thalamus lacunar infarct. Patchy T2 hyperintense supratentorial white matter signal, exclusive of the aforementioned abnormality. No midline shift, mass effect or mass lesions. 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 No abnormal extra-axial fluid collections. Normal major intracranial vascular flow voids seen at the skull base. Ocular globes and orbital contents are unremarkable. Paranasal sinuses and mastoid air cells are well aerated. No abnormal sellar expansion. No suspicious calvarial bone marrow signal. No cerebellar tonsillar ectopia. Patient is edentulous. IMPRESSION: No acute intracranial process, no MR findings of acute hemorrhage. 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 Mineralization LEFT temporal occipital lobes corresponding to CT finding. Mild to moderate white matter changes compatible with chronic small vessel ischemic disease. Electronically Signed   By: Elon Alas M.D.   On: 06/08/2015 02:41    PHYSICAL EXAM  General: Obese white male, alert, cooperative, NAD. HEENT: PERRL, EOMI. Moist mucus membranes Neck: Full range of motion without pain, supple, no lymphadenopathy or carotid bruits Lungs: Clear to ascultation bilaterally, normal work of respiration, no wheezes, rales, rhonchi Heart: RRR, no murmurs, gallops, or rubs Abdomen: Soft, non-tender, non-distended, BS + Extremities: No cyanosis, clubbing, or edema  Mental Status -  Level of arousal and orientation to time, place, and person were intact. Some  slurring of speech, word finding difficulties, mild short term memory impairment, and cognitive dysfunction, however this is chronic in nature, no acute findings.  Attention span and concentration were normal. Fund of Knowledge mildly impaired.   Cranial Nerves II - XII - II - Visual field w/ some right sided peripheral vision defect.  III, IV, VI - Extraocular movements intact. V - Facial sensation intact bilaterally. VII - Facial movement intact bilaterally. VIII - Hearing & vestibular intact bilaterally. X - Palate elevates symmetrically. XI - Chin turning & shoulder shrug intact bilaterally. XII - Tongue protrusion intact.  Motor Strength - The patient's strength was normal in all extremities and pronator drift was absent.  Bulk was normal and fasciculations were absent.   Motor Tone - Muscle tone was assessed at the neck and appendages and was normal.  Reflexes - The patient's reflexes were 1+ in all extremities and he had no pathological reflexes.  Sensory - Light touch, temperature/pinprick, vibration and proprioception, and Romberg testing were assessed and were symmetrical.    Coordination - The patient had normal movements in the hands and feet with no ataxia or dysmetria.  Tremor was absent.  Gait and Station - The patient's transfers, posture, gait, station, and turns were observed as normal.   ASSESSMENT/PLAN [name] is a 59 y.o. male w/ PMHx of HTN, traumatic brain injury, and seizures, presented to [facility] with acute onset of balance disturbance, thought to be 2/2 left occipital hemorrhage, however this was not shown on MRI. Most likely 2/2 orthostasis.   Dizziness/Lightheadedness:  Most likely a result of orthostatic changes as symptoms resolved w/ IVF.  MRI: No acute intracranial process, no MR findings of acute hemorrhage. Extensive LEFT temporal occipital parietal encephalomalacia. Mineralization LEFT temporal occipital lobes corresponding to CT  finding.  SCD's for VTE prophylaxis  Diet regular Room service appropriate?: Yes; Fluid consistency:: Thin  aspirin 81 mg daily + Plavix 75 mg daily prior to admission, now on aspirin 81 mg daily  Patient counseled to be compliant with his antithrombotic medications  Therapy recommendations:  No PT recs at this time.   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   Hx stroke/TIA   Hospital day # 1  Patient w/ PMHx of HTN, seizures, and previous h/o TBI, transferred from [facility] for balance disturbance, dizziness, and lightheadedness. CT scan suggested possible left occipital hemorrhage. MRI performed which showed no active hemorrhage or acute abnormality. Findings on CT likely 2/2 calcification d/t old TBI. Symptoms resolved w/ IVF in the ED, therefore likely 2/2 dehydration and orthostatic changes. PT evaluation suggests no further PT follow up. Patient stable for discharge home.    Natasha Bence, MD PGY-3, Internal Medicine Pager: [phone] I have personally examined this patient, reviewed notes, independently viewed imaging studies, participated in medical decision making and plan of care. I have made any additions or clarifications directly to the above note. Agree with note above.  The patient presented with transient episodes of dizziness, lightheadedness and falling backwards likely related to orthostasis and dehydration. CT head had suggested possibility of left temporal hemorrhage but MRI scan shows that to be encephalomalacia from prior traumatic brain injury and no acute abnormalities are noted. I do not feel further neurological  evaluation is testing is necessary. Patient was advised to stay on the current dose of Tegretol for seizure prophylaxis and has not had seizures for many years. He was advised to follow-up with his primary physician. No routine follow-up neurological appointment is necessary at this time  Antony Contras, [place] Pager: [phone] 06/08/2015 4:20 PM     To contact Stroke Continuity provider, please refer to [email or web address] After hours, contact General Neurology

## 2015-10-10 ENCOUNTER — Other Ambulatory Visit: Payer: Self-pay | Admitting: Internal Medicine

## 2015-10-10 DIAGNOSIS — J9600 Acute respiratory failure, unspecified whether with hypoxia or hypercapnia: Principal | ICD-10-CM

## 2015-10-10 DIAGNOSIS — J449 Chronic obstructive pulmonary disease, unspecified: Secondary | ICD-10-CM

## 2015-10-13 ENCOUNTER — Ambulatory Visit: Admission: RE | Admit: 2015-10-13 | Payer: Medicare Other | Source: Ambulatory Visit

## 2015-10-14 ENCOUNTER — Ambulatory Visit
Admission: RE | Admit: 2015-10-14 | Discharge: 2015-10-14 | Disposition: A | Discharge status: Home / Self Care | Payer: Medicare Other | Source: Ambulatory Visit | Attending: Internal Medicine | Admitting: Internal Medicine

## 2015-10-14 DIAGNOSIS — J9600 Acute respiratory failure, unspecified whether with hypoxia or hypercapnia: Secondary | ICD-10-CM | POA: Insufficient documentation

## 2015-10-14 DIAGNOSIS — R918 Other nonspecific abnormal finding of lung field: Secondary | ICD-10-CM | POA: Insufficient documentation

## 2015-10-14 DIAGNOSIS — J449 Chronic obstructive pulmonary disease, unspecified: Secondary | ICD-10-CM

## 2015-10-14 DIAGNOSIS — I700 Atherosclerosis of aorta: Secondary | ICD-10-CM | POA: Insufficient documentation

## 2015-10-14 DIAGNOSIS — I2510 Atherosclerotic heart disease of native coronary artery without angina pectoris: Secondary | ICD-10-CM | POA: Diagnosis not present

## 2015-10-14 MED ORDER — IOHEXOL 350 MG/ML SOLN
75.0000 mL | Freq: Once | INTRAVENOUS | Status: AC | PRN
  Administered 2015-10-14: 75 mL via INTRAVENOUS

## 2015-12-05 ENCOUNTER — Other Ambulatory Visit: Payer: Self-pay | Admitting: Vascular Surgery

## 2015-12-13 ENCOUNTER — Ambulatory Visit
Admission: RE | Admit: 2015-12-13 | Discharge: 2015-12-13 | Disposition: A | Discharge status: Home / Self Care | Payer: Medicare Other | Source: Ambulatory Visit | Attending: Vascular Surgery | Admitting: Vascular Surgery

## 2015-12-13 ENCOUNTER — Encounter: Payer: Self-pay | Admitting: *Deleted

## 2015-12-13 ENCOUNTER — Encounter
Admission: RE | Disposition: A | Discharge status: Home / Self Care | Payer: Self-pay | Source: Ambulatory Visit | Attending: Vascular Surgery

## 2015-12-13 DIAGNOSIS — E785 Hyperlipidemia, unspecified: Secondary | ICD-10-CM | POA: Insufficient documentation

## 2015-12-13 DIAGNOSIS — Z7951 Long term (current) use of inhaled steroids: Secondary | ICD-10-CM | POA: Insufficient documentation

## 2015-12-13 DIAGNOSIS — F17200 Nicotine dependence, unspecified, uncomplicated: Secondary | ICD-10-CM | POA: Insufficient documentation

## 2015-12-13 DIAGNOSIS — I2510 Atherosclerotic heart disease of native coronary artery without angina pectoris: Secondary | ICD-10-CM | POA: Insufficient documentation

## 2015-12-13 DIAGNOSIS — Z6826 Body mass index (BMI) 26.0-26.9, adult: Secondary | ICD-10-CM | POA: Insufficient documentation

## 2015-12-13 DIAGNOSIS — I739 Peripheral vascular disease, unspecified: Secondary | ICD-10-CM | POA: Insufficient documentation

## 2015-12-13 DIAGNOSIS — H409 Unspecified glaucoma: Secondary | ICD-10-CM | POA: Diagnosis not present

## 2015-12-13 DIAGNOSIS — M7989 Other specified soft tissue disorders: Secondary | ICD-10-CM | POA: Diagnosis not present

## 2015-12-13 DIAGNOSIS — Z809 Family history of malignant neoplasm, unspecified: Secondary | ICD-10-CM | POA: Diagnosis not present

## 2015-12-13 DIAGNOSIS — I998 Other disorder of circulatory system: Secondary | ICD-10-CM | POA: Insufficient documentation

## 2015-12-13 DIAGNOSIS — M79609 Pain in unspecified limb: Secondary | ICD-10-CM | POA: Diagnosis not present

## 2015-12-13 DIAGNOSIS — R569 Unspecified convulsions: Secondary | ICD-10-CM | POA: Diagnosis not present

## 2015-12-13 DIAGNOSIS — Z7902 Long term (current) use of antithrombotics/antiplatelets: Secondary | ICD-10-CM | POA: Insufficient documentation

## 2015-12-13 DIAGNOSIS — E669 Obesity, unspecified: Secondary | ICD-10-CM | POA: Diagnosis not present

## 2015-12-13 DIAGNOSIS — Z823 Family history of stroke: Secondary | ICD-10-CM | POA: Insufficient documentation

## 2015-12-13 DIAGNOSIS — I70223 Atherosclerosis of native arteries of extremities with rest pain, bilateral legs: Secondary | ICD-10-CM | POA: Insufficient documentation

## 2015-12-13 DIAGNOSIS — Z9889 Other specified postprocedural states: Secondary | ICD-10-CM | POA: Insufficient documentation

## 2015-12-13 DIAGNOSIS — Z8249 Family history of ischemic heart disease and other diseases of the circulatory system: Secondary | ICD-10-CM | POA: Diagnosis not present

## 2015-12-13 DIAGNOSIS — I10 Essential (primary) hypertension: Secondary | ICD-10-CM | POA: Diagnosis not present

## 2015-12-13 DIAGNOSIS — Z79899 Other long term (current) drug therapy: Secondary | ICD-10-CM | POA: Insufficient documentation

## 2015-12-13 HISTORY — DX: Atherosclerotic heart disease of native coronary artery without angina pectoris: I25.10

## 2015-12-13 HISTORY — PX: PERIPHERAL VASCULAR CATHETERIZATION: SHX172C

## 2015-12-13 HISTORY — DX: Unspecified glaucoma: H40.9

## 2015-12-13 HISTORY — DX: Peripheral vascular disease, unspecified: I73.9

## 2015-12-13 HISTORY — DX: Pneumothorax, unspecified: J93.9

## 2015-12-13 HISTORY — DX: Hyperlipidemia, unspecified: E78.5

## 2015-12-13 LAB — CREATININE, SERUM
Creatinine, Ser: 0.78 mg/dL (ref 0.61–1.24)
GFR calc Af Amer: >60 mL/min (ref >60)
GFR calc non Af Amer: >60 mL/min (ref >60)

## 2015-12-13 LAB — BUN: BUN: 11 mg/dL (ref 6–20)

## 2015-12-13 SURGERY — LOWER EXTREMITY ANGIOGRAPHY
Anesthesia: Moderate Sedation | Laterality: Left

## 2015-12-13 MED ORDER — CLOPIDOGREL BISULFATE 75 MG PO TABS: 75.0000 mg | ORAL_TABLET | Freq: Every day | ORAL | Status: DC

## 2015-12-13 MED ORDER — ONDANSETRON HCL 4 MG/2ML IJ SOLN: 4.0000 mg | Freq: Four times a day (QID) | INTRAMUSCULAR | Status: DC | PRN

## 2015-12-13 MED ORDER — ALUM & MAG HYDROXIDE-SIMETH 200-200-20 MG/5ML PO SUSP: 15.0000 mL | ORAL | Status: DC | PRN

## 2015-12-13 MED ORDER — HEPARIN (PORCINE) IN NACL 2-0.9 UNIT/ML-% IJ SOLN
INTRAMUSCULAR | Status: AC
  Filled 2015-12-13: qty 1000

## 2015-12-13 MED ORDER — IOPAMIDOL (ISOVUE-300) INJECTION 61%
INTRAVENOUS | Status: DC | PRN
  Administered 2015-12-13: 105 mL via INTRA_ARTERIAL

## 2015-12-13 MED ORDER — HYDROMORPHONE HCL 1 MG/ML IJ SOLN: 0.5000 mg | INTRAMUSCULAR | Status: DC | PRN

## 2015-12-13 MED ORDER — LIDOCAINE HCL (PF) 1 % IJ SOLN
INTRAMUSCULAR | Status: AC
  Filled 2015-12-13: qty 30

## 2015-12-13 MED ORDER — SODIUM CHLORIDE 0.9 % IV SOLN
INTRAVENOUS | Status: DC
  Administered 2015-12-13 (×2): via INTRAVENOUS

## 2015-12-13 MED ORDER — MIDAZOLAM HCL 2 MG/2ML IJ SOLN
INTRAMUSCULAR | Status: DC | PRN
  Administered 2015-12-13: 2 mg via INTRAVENOUS

## 2015-12-13 MED ORDER — LABETALOL HCL 5 MG/ML IV SOLN: 10.0000 mg | INTRAVENOUS | Status: DC | PRN

## 2015-12-13 MED ORDER — CLOPIDOGREL BISULFATE 75 MG PO TABS: 300.0000 mg | ORAL_TABLET | ORAL | Status: DC

## 2015-12-13 MED ORDER — OXYCODONE HCL 5 MG PO TABS
5.0000 mg | ORAL_TABLET | ORAL | Status: DC | PRN
  Filled 2015-12-13: qty 2

## 2015-12-13 MED ORDER — HEPARIN SODIUM (PORCINE) 1000 UNIT/ML IJ SOLN
INTRAMUSCULAR | Status: AC
  Filled 2015-12-13: qty 1

## 2015-12-13 MED ORDER — DIPHENHYDRAMINE HCL 50 MG/ML IJ SOLN
INTRAMUSCULAR | Status: AC
  Filled 2015-12-13: qty 1

## 2015-12-13 MED ORDER — HEPARIN SODIUM (PORCINE) 1000 UNIT/ML IJ SOLN
INTRAMUSCULAR | Status: DC | PRN
  Administered 2015-12-13: 4000 [IU] via INTRAVENOUS

## 2015-12-13 MED ORDER — MIDAZOLAM HCL 5 MG/5ML IJ SOLN
INTRAMUSCULAR | Status: AC
  Filled 2015-12-13: qty 5

## 2015-12-13 MED ORDER — DIPHENHYDRAMINE HCL 50 MG/ML IJ SOLN
INTRAMUSCULAR | Status: DC | PRN
  Administered 2015-12-13: 50 mg via INTRAVENOUS

## 2015-12-13 MED ORDER — LIDOCAINE HCL (PF) 1 % IJ SOLN
INTRAMUSCULAR | Status: DC | PRN
  Administered 2015-12-13: 5 mL

## 2015-12-13 MED ORDER — METHYLPREDNISOLONE SODIUM SUCC 125 MG IJ SOLR: 125.0000 mg | INTRAMUSCULAR | Status: DC | PRN

## 2015-12-13 MED ORDER — FENTANYL CITRATE (PF) 100 MCG/2ML IJ SOLN
INTRAMUSCULAR | Status: AC
  Filled 2015-12-13: qty 2

## 2015-12-13 MED ORDER — ACETAMINOPHEN 325 MG RE SUPP
325.0000 mg | RECTAL | Status: DC | PRN
  Filled 2015-12-13: qty 2

## 2015-12-13 MED ORDER — CEFUROXIME SODIUM 1.5 G IJ SOLR
1.5000 g | INTRAMUSCULAR | Status: AC
  Administered 2015-12-13: 1.5 g via INTRAVENOUS

## 2015-12-13 MED ORDER — FENTANYL CITRATE (PF) 100 MCG/2ML IJ SOLN
INTRAMUSCULAR | Status: DC | PRN
  Administered 2015-12-13: 50 ug via INTRAVENOUS

## 2015-12-13 MED ORDER — HYDRALAZINE HCL 20 MG/ML IJ SOLN: 5.0000 mg | INTRAMUSCULAR | Status: DC | PRN

## 2015-12-13 MED ORDER — ACETAMINOPHEN 325 MG PO TABS: 325.0000 mg | ORAL_TABLET | ORAL | Status: DC | PRN

## 2015-12-13 MED ORDER — HYDROMORPHONE HCL 1 MG/ML IJ SOLN: 1.0000 mg | Freq: Once | INTRAMUSCULAR | Status: DC

## 2015-12-13 MED ORDER — METOPROLOL TARTRATE 5 MG/5ML IV SOLN: 5.0000 mg | Freq: Four times a day (QID) | INTRAVENOUS | Status: DC

## 2015-12-13 MED ORDER — FAMOTIDINE 20 MG PO TABS: 40.0000 mg | ORAL_TABLET | ORAL | Status: DC | PRN

## 2015-12-13 SURGICAL SUPPLY — 21 items
BALLN LUTONIX DCB 4X80X130 (BALLOONS) ×4
BALLN LUTONIX DCB 7X40X130 (BALLOONS) ×4
BALLOON LUTONIX DCB 4X80X130 (BALLOONS) ×2 IMPLANT
BALLOON LUTONIX DCB 7X40X130 (BALLOONS) ×2 IMPLANT
CATH 5F KA2 (CATHETERS) ×4 IMPLANT
CATH KA2 5FR 65CM (CATHETERS) ×4 IMPLANT
CATH PIG 70CM (CATHETERS) ×4 IMPLANT
CATH RIM 65CM (CATHETERS) ×4 IMPLANT
DEVICE PRESTO INFLATION (MISCELLANEOUS) ×4 IMPLANT
DEVICE STARCLOSE SE CLOSURE (Vascular Products) ×4 IMPLANT
DEVICE TORQUE (MISCELLANEOUS) ×4 IMPLANT
GLIDEWIRE ANGLED SS 035X260CM (WIRE) ×4 IMPLANT
PACK ANGIOGRAPHY (CUSTOM PROCEDURE TRAY) ×4 IMPLANT
SET INTRO CAPELLA COAXIAL (SET/KITS/TRAYS/PACK) ×4 IMPLANT
SHEATH BRITE TIP 5FRX11 (SHEATH) ×4 IMPLANT
SHEATH HIGHFLEX ANSEL 6FRX55 (SHEATH) ×4 IMPLANT
STENT LIFESTAR 8X40 (Permanent Stent) ×4 IMPLANT
SYR MEDRAD MARK V 150ML (SYRINGE) ×4 IMPLANT
TUBING CONTRAST HIGH PRESS 72 (TUBING) ×4 IMPLANT
WIRE J 3MM .035X145CM (WIRE) ×4 IMPLANT
WIRE MAGIC TORQUE 260C (WIRE) ×4 IMPLANT

## 2015-12-13 NOTE — Op Note (Signed)
[facility]  Percutaneous Study/Intervention Procedural Note   Date of Surgery: 12/13/2015,12:02 PM  Surgeon:Schnier, Dolores Lory   Pre-operative Diagnosis: Atherosclerotic occlusive disease bilateral lower extremities with rest pain of the left lower extremity; complication vascular device with in-stent restenosis  Post-operative diagnosis:  Same  Procedure(s) Performed:  1.  Abdominal aortogram  2.  Left lower extremity distal runoff third order catheter placement  3.  Percutaneous transluminal angioplasty to 4 mm left SFA and common femoral with a Lutonix balloon  4.  Percutaneous transluminal angioplasty and stent placement left external iliac artery with a 8 x 40 LifeStar stent postdilated to 7 mm with Lutonix   Anesthesia: Conscious sedation was administered under my direct supervision. IV Versed plus fentanyl were utilized. Continuous ECG, pulse oximetry and blood pressure was monitored throughout the entire procedure. Versed and  fentanyl were utilized.  Conscious sedation was administered for a total of 70 minutes.  Sheath: 6 [place] Ansel high flex right common femoral artery  Contrast: 105 cc   Fluoroscopy Time: 9.3 minutes  Indications:  The patient presented to the office with increasing pain in his left lower extremity as well as a marked reduction in his walking distance. Noninvasive studies as well as physical examination demonstrated significant deterioration compared was last visit. The risks and benefits for angiography and intervention were reviewed all questions were answered patient has agreed to proceed.  Procedure:  Xavier Conley a 60 y.o. male who was identified and appropriate procedural time out was performed.  The patient was then placed supine on the table and prepped and draped in the usual sterile fashion.  Ultrasound was used to evaluate the right common femoral artery.  It was patent .  A digital ultrasound image was acquired.  A  micropuncture needle was used to access the right common femoral artery under direct ultrasound guidance and a permanent image wassaved for the record.microwire was then advanced under fluoroscopic guidance followed by micro-sheath.  A 0.035 J wire was advanced without resistance and a 5Fr sheath was placed.    J-wire pigtail catheter was then negotiated to the level of T12 and AP projection of the aorta was obtained. Pigtail catheter was then repositioned and an LAO well as an RAO projection of the pelvis was obtained.  After review these images stiff angle Glidewire and a rim catheter were used to cross the aortic bifurcation catheter was advanced down to the distal external iliac on the left and an LAO projection of the left common femoral and femoral bifurcation was obtained.  Based on these images 5000 units heparin heparin was given and allowed to circulate. Stiff angled Glidewire was reintroduced and an Ansel 6 [place] high flex sheath was advanced up and over the bifurcation and positioned with its tip in the distal external iliac artery on the left. Using a combination of a Kumpe catheter as well as the pigtail catheter and the Glidewire the occlusion of the proximal SFA was negotiated. Hand injection contrast verified intraluminal positioning in the mid SFA.  Hand injection contrast was then used to create distal runoff. A 260 Magic torque wire was then introduced and the pigtail catheter was removed. A 4 x 80 Lutonix balloon was then advanced across the distal common femoral and proximal SFA occlusion inflation was to 8 atm for 3 minutes. Follow-up imaging demonstrated a marked improvement with now a rapid filling of the SFA. However, heavily calcified bulky disease still exists in the common femoral as well  as at the origin of the SFA.  The detector was then repositioned to the pelvis and magnified imaging of the iliac stenosis in the distal common and proximal external iliac artery on the left  were obtained. A 8 x 40 LifeStar stent was then deployed across the lesion and postdilated with a 7 x 40 Lutonix balloon inflated to 12 atm. Follow-up imaging which included distal runoff demonstrated wide patency.  The sheath was then pulled into the external iliac on the right oblique view of the right groin was obtained and a Star close device deployed without difficulty. There were no immediate complications.  Findings:   Aortogram:  The abdominal aorta is opacified with contrast it demonstrates heavily calcified diffuse disease but there are no hemodynamically significant lesions noted within the aorta. The common iliac arteries are again noted to be heavily calcified bilaterally previously placed stents are noted on the right there is approximate 40-50% in-stent restenosis on the left at the distal margin of the stent extending 2-3 cm into the external iliac artery there is a 60-70% stenosis. Following placement of a stent at this level and postdilated to 7 mm there is total resolution of this lesion.  Right Lower Extremity:  The right common femoral demonstrates bulky calcific disease distally the area of puncture is widely patent.  Left Lower Extremity:  The left common femoral as well as the origin of the SFA and the more distal origin of the profunda which appears to have a large bifurcation at its origin demonstrate occlusive heavily calcified oral reef like plaque. The SFA is reconstituted after approximately 4-5 cm and is patent throughout its entire course although there is heavy calcifications and moderate disease at Hunter's canal. Popliteal is patent and the trifurcation is patent. Posterior tibial appears to be the dominant runoff to the foot and is patent throughout it's course anterior tibial is also patent but appears to have moderate to severe disease proximally. Peroneal is patent quite small.  Following angioplasty of the common femoral and proximal SFA there is now patency there is  still evidence of heavily calcified bulky plaque however the flow dynamic is markedly improved.   SUMMARY: Successful recanalization of the left iliac stenosis as well as recanalization of the SFA on the left. However, given the extensive bulky calcified plaque within the common femoral which is affecting both the profunda as well as the SFA a strong consideration for open endarterectomy must be given as this would be a much more durable and effective treatment for his future needs.  Disposition: Patient was taken to the recovery room in stable condition having tolerated the procedure well.  Schnier, Dolores Lory 12/13/2015,12:02 PM

## 2015-12-13 NOTE — Discharge Instructions (Signed)
Angiogram, Care After °Refer to this sheet in the next few weeks. These instructions provide you with information about caring for yourself after your procedure. Your health care provider may also give you more specific instructions. Your treatment has been planned according to current medical practices, but problems sometimes occur. Call your health care provider if you have any problems or questions after your procedure. °WHAT TO EXPECT AFTER THE PROCEDURE °After your procedure, it is typical to have the following: °· Bruising at the catheter insertion site that usually fades within 1-2 weeks. °· Blood collecting in the tissue (hematoma) that may be painful to the touch. It should usually decrease in size and tenderness within 1-2 weeks. °HOME CARE INSTRUCTIONS °· Take medicines only as directed by your health care provider. °· You may shower 24-48 hours after the procedure or as directed by your health care provider. Remove the bandage (dressing) and gently wash the site with plain soap and water. Pat the area dry with a clean towel. Do not rub the site, because this may cause bleeding. °· Do not take baths, swim, or use a hot tub until your health care provider approves. °· Check your insertion site every day for redness, swelling, or drainage. °· Do not apply powder or lotion to the site. °· Do not lift over 10 lb (4.5 kg) for 5 days after your procedure or as directed by your health care provider. °· Ask your health care provider when it is okay to: °¨ Return to work or school. °¨ Resume usual physical activities or sports. °¨ Resume sexual activity. °· Do not drive home if you are discharged the same day as the procedure. Have someone else drive you. °· You may drive 24 hours after the procedure unless otherwise instructed by your health care provider. °· Do not operate machinery or power tools for 24 hours after the procedure or as directed by your health care provider. °· If your procedure was done as an  outpatient procedure, which means that you went home the same day as your procedure, a responsible adult should be with you for the first 24 hours after you arrive home. °· Keep all follow-up visits as directed by your health care provider. This is important. °SEEK MEDICAL CARE IF: °· You have a fever. °· You have chills. °· You have increased bleeding from the catheter insertion site. Hold pressure on the site. °SEEK IMMEDIATE MEDICAL CARE IF: °· You have unusual pain at the catheter insertion site. °· You have redness, warmth, or swelling at the catheter insertion site. °· You have drainage (other than a small amount of blood on the dressing) from the catheter insertion site. °· The catheter insertion site is bleeding, and the bleeding does not stop after 30 minutes of holding steady pressure on the site. °· The area near or just beyond the catheter insertion site becomes pale, cool, tingly, or numb. °  °This information is not intended to replace advice given to you by your health care provider. Make sure you discuss any questions you have with your health care provider. °  °Document Released: 02/08/2005 Document Revised: 08/13/2014 Document Reviewed: 12/24/2012 °Elsevier Interactive Patient Education ©2016 [employer] ° °

## 2015-12-13 NOTE — H&P (Signed)
[facility] History & Physical Update  The patient was interviewed and re-examined.  The patient's previous History and Physical has been reviewed and is unchanged.  There is no change in the plan of care. We plan to proceed with the scheduled procedure.  Sophiana Milanese, Dolores Lory, MD  12/13/2015, 10:58 AM

## 2015-12-13 NOTE — Progress Notes (Signed)
Pt clinically stable post procedure with vss, no bleeding nor hematoma at right groin site, discharge instructions given with questions answered, offers no complaints

## 2016-03-09 ENCOUNTER — Encounter: Payer: Self-pay | Admitting: *Deleted

## 2016-07-23 ENCOUNTER — Encounter (INDEPENDENT_AMBULATORY_CARE_PROVIDER_SITE_OTHER): Payer: Self-pay

## 2016-07-23 ENCOUNTER — Ambulatory Visit (INDEPENDENT_AMBULATORY_CARE_PROVIDER_SITE_OTHER): Payer: Self-pay | Admitting: Vascular Surgery

## 2016-08-09 ENCOUNTER — Encounter (INDEPENDENT_AMBULATORY_CARE_PROVIDER_SITE_OTHER): Payer: Medicare Other

## 2016-08-09 ENCOUNTER — Other Ambulatory Visit (INDEPENDENT_AMBULATORY_CARE_PROVIDER_SITE_OTHER): Payer: Self-pay | Admitting: Vascular Surgery

## 2016-08-09 ENCOUNTER — Ambulatory Visit (INDEPENDENT_AMBULATORY_CARE_PROVIDER_SITE_OTHER): Payer: Medicare Other | Admitting: Vascular Surgery

## 2016-08-09 ENCOUNTER — Encounter (INDEPENDENT_AMBULATORY_CARE_PROVIDER_SITE_OTHER): Payer: Self-pay | Admitting: Vascular Surgery

## 2016-08-09 DIAGNOSIS — I10 Essential (primary) hypertension: Secondary | ICD-10-CM

## 2016-08-09 DIAGNOSIS — I739 Peripheral vascular disease, unspecified: Secondary | ICD-10-CM

## 2016-08-09 DIAGNOSIS — E782 Mixed hyperlipidemia: Secondary | ICD-10-CM | POA: Diagnosis not present

## 2016-08-09 DIAGNOSIS — J439 Emphysema, unspecified: Secondary | ICD-10-CM | POA: Diagnosis not present

## 2016-08-09 DIAGNOSIS — I209 Angina pectoris, unspecified: Secondary | ICD-10-CM | POA: Diagnosis not present

## 2016-08-09 DIAGNOSIS — I25118 Atherosclerotic heart disease of native coronary artery with other forms of angina pectoris: Secondary | ICD-10-CM | POA: Diagnosis not present

## 2016-08-09 DIAGNOSIS — E785 Hyperlipidemia, unspecified: Secondary | ICD-10-CM | POA: Insufficient documentation

## 2016-08-09 DIAGNOSIS — I2510 Atherosclerotic heart disease of native coronary artery without angina pectoris: Secondary | ICD-10-CM | POA: Insufficient documentation

## 2016-08-09 DIAGNOSIS — I70219 Atherosclerosis of native arteries of extremities with intermittent claudication, unspecified extremity: Secondary | ICD-10-CM | POA: Diagnosis not present

## 2016-08-09 DIAGNOSIS — J449 Chronic obstructive pulmonary disease, unspecified: Secondary | ICD-10-CM | POA: Insufficient documentation

## 2016-08-09 NOTE — Progress Notes (Signed)
MRN : [id number]  Xavier Conley is a 61 y.o. ([birth year]) male who presents with chief complaint of  Chief Complaint  Patient presents with  . Follow-up  .  History of Present Illness: The patient returns to the office for followup and review of the noninvasive studies. There have been no interval changes in lower extremity symptoms. No interval shortening of the patient's claudication distance or development of rest pain symptoms. No new ulcers or wounds have occurred since the last visit.  There have been no significant changes to the patient's overall health care.  The patient denies amaurosis fugax or recent TIA symptoms. There are no recent neurological changes noted. The patient denies history of DVT, PE or superficial thrombophlebitis. The patient denies recent episodes of angina or shortness of breath.   ABI Rt=0.88 and Lt=0.68  (previous ABI's Rt=0.96 and Lt=0.78) Duplex ultrasound of the left lower extremity shows a patent stent in the external iliac, the common femoral artery disease is again noted SFA remains patent  Current Meds  Medication Sig  . acetaZOLAMIDE (DIAMOX) 250 MG tablet Take 250 mg by mouth daily.   Marland Kitchen albuterol (PROVENTIL HFA;VENTOLIN HFA) 108 (90 BASE) MCG/ACT inhaler Inhale 2 puffs into the lungs every 4 (four) hours as needed for wheezing or shortness of breath.  Marland Kitchen amLODipine (NORVASC) 5 MG tablet Take 5 mg by mouth daily.  Marland Kitchen aspirin EC 81 MG tablet Take 81 mg by mouth daily.  Marland Kitchen atorvastatin (LIPITOR) 20 MG tablet Take 20 mg by mouth daily.   . carbamazepine (TEGRETOL XR) 200 MG 12 hr tablet Take 200 mg by mouth 2 (two) times daily. Pt takes with a 400mg  tablet.  . carbamazepine (TEGRETOL XR) 400 MG 12 hr tablet Take 400 mg by mouth 2 (two) times daily. Pt takes with a 200mg  tablet.  . clopidogrel (PLAVIX) 75 MG tablet Take 75 mg by mouth daily.  . isosorbide mononitrate (IMDUR) 30 MG 24 hr tablet Take 30 mg by mouth daily.  Marland Kitchen losartan (COZAAR) 100  MG tablet Take 100 mg by mouth daily.    Past Medical History:  Diagnosis Date  . Coronary artery disease   . Glaucoma   . Hyperlipidemia   . Hypertension   . Peripheral vascular disease ([place])   . Pneumothorax   . Seizures ([place])     Past Surgical History:  Procedure Laterality Date  . BRAIN SURGERY    . PERIPHERAL VASCULAR CATHETERIZATION Left 12/13/2015   Procedure: Lower Extremity Angiography;  Surgeon: Katha Cabal, MD;  Location: [facility];  Service: Cardiovascular;  Laterality: Left;  . PERIPHERAL VASCULAR CATHETERIZATION  12/13/2015   Procedure: Lower Extremity Intervention;  Surgeon: Katha Cabal, MD;  Location: [facility];  Service: Cardiovascular;;    Social History Social History  Substance Use Topics  . Smoking status: Current Every Day Smoker    Packs/day: 0.50    Years: 47.00    Types: Cigarettes  . Smokeless tobacco: Not on file  . Alcohol use No    Family History No family history on file. No family history of bleeding/clotting disorders, porphyria or autoimmune disease   No Known Allergies   REVIEW OF SYSTEMS (Negative unless checked)  Constitutional: [] Weight loss  [] Fever  [] Chills Cardiac: [] Chest pain   [] Chest pressure   [] Palpitations   [] Shortness of breath when laying flat   [] Shortness of breath with exertion. Vascular:  [x] Pain in legs with walking   [] Pain in legs  at rest  [] History of DVT   [] Phlebitis   [] Swelling in legs   [] Varicose veins   [] Non-healing ulcers Pulmonary:   [] Uses home oxygen   [] Productive cough   [] Hemoptysis   [] Wheeze  [] COPD   [] Asthma Neurologic:  [] Dizziness   [] Seizures   [] History of stroke   [] History of TIA  [] Aphasia   [] Vissual changes   [] Weakness or numbness in arm   [] Weakness or numbness in leg Musculoskeletal:   [] Joint swelling   [x] Joint pain   [] Low back pain Hematologic:  [] Easy bruising  [] Easy bleeding   [] Hypercoagulable state   [] Anemic Gastrointestinal:  [] Diarrhea    [] Vomiting  [] Gastroesophageal reflux/heartburn   [] Difficulty swallowing. Genitourinary:  [] Chronic kidney disease   [] Difficult urination  [] Frequent urination   [] Blood in urine Skin:  [] Rashes   [] Ulcers  Psychological:  [] History of anxiety   []  History of major depression.  Physical Examination  Vitals:   08/09/16 1631  BP: (!) 164/88  Pulse: 73  Resp: 17  Weight: 178 lb (80.7 kg)  Height: 5\' 8"  (1.727 m)   Body mass index is 27.06 kg/m. Gen: WD/WN, NAD Head: [place]/AT, No temporalis wasting.  Ear/Nose/Throat: Hearing grossly intact, nares w/o erythema or drainage, poor dentition Eyes: PER, EOMI, sclera nonicteric.  Neck: Supple, no masses.  No bruit or JVD.  Pulmonary:  Good air movement, clear to auscultation bilaterally, no use of accessory muscles.  Cardiac: RRR, normal S1, S2, no Murmurs. Vascular:  Feet pink and warm with brisk cap refill no ulcers Vessel Right Left  Radial Palpable Palpable  Ulnar Palpable Palpable  Brachial Palpable Palpable  Carotid Palpable Palpable  Femoral Palpable Palpable  Popliteal Palpable Trace Palpable  PT 1+ Palpable Trace Palpable  DP 1+ Palpable Trace Palpable   Gastrointestinal: soft, non-distended. No guarding/no peritoneal signs.  Musculoskeletal: M/S 5/5 throughout.  No deformity or atrophy.  Neurologic: CN 2-12 intact. Pain and light touch intact in extremities.  Symmetrical.  Speech is fluent. Motor exam as listed above. Psychiatric: Judgment intact, Mood & affect appropriate for pt's clinical situation. Dermatologic: No rashes or ulcers noted.  No changes consistent with cellulitis. Lymph : No Cervical lymphadenopathy, no lichenification or skin changes of chronic lymphedema.  CBC Lab Results  Component Value Date   WBC 5.8 06/07/2015   HGB 13.4 06/07/2015   HCT 39.3 (L) 06/07/2015   MCV 91.8 06/07/2015   PLT 189 06/07/2015    BMET    Component Value Date/Time   NA 138 06/07/2015 1812   K 3.9 06/07/2015 1812   CL  105 06/07/2015 1812   CO2 27 06/07/2015 1812   GLUCOSE 95 06/07/2015 1812   BUN 11 12/13/2015 0718   BUN 19 (H) 10/20/2013 0805   CREATININE 0.78 12/13/2015 0718   CREATININE 0.92 10/20/2013 0805   CALCIUM 8.7 (L) 06/07/2015 1812   GFRNONAA >60 12/13/2015 0718   GFRNONAA >60 10/20/2013 0805   GFRAA >60 12/13/2015 0718   GFRAA >60 10/20/2013 0805   CrCl cannot be calculated (Patient's most recent lab result is older than the maximum 21 days allowed.).  COAG No results found for: INR, PROTIME  Radiology No results found.   Assessment/Plan 1. Atherosclerosis of lower extremity with claudication ([place])  Recommend:  The patient has evidence of atherosclerosis of the lower extremities with claudication.  The patient does not voice lifestyle limiting changes at this point in time.  Noninvasive studies do not suggest clinically significant change.  No invasive studies,  angiography or surgery at this time The patient should continue walking and begin a more formal exercise program.  The patient should continue antiplatelet therapy and aggressive treatment of the lipid abnormalities  No changes in the patient's medications at this time  The patient should continue wearing graduated compression socks 10-15 mmHg strength to control the mild edema.   - VAS [place] ABI WITH/WO TBI; Future - VAS [place] LOWER EXTREMITY ARTERIAL DUPLEX; Future  2. Essential hypertension Continue antihypertensive medications as already ordered, these medications have been reviewed and there are no changes at this time.  3. Mixed hyperlipidemia Continue statin as ordered and reviewed, no changes at this time  4. Pulmonary emphysema, unspecified emphysema type ([place]) Continue pulmonary medications and aerosols as already ordered, these medications have been reviewed and there are no changes at this time.  5. Coronary artery disease of native artery of native heart with stable angina pectoris ([place]) Continue  cardiac and antihypertensive medications as already ordered and reviewed, no changes at this time.  Continue statin as ordered and reviewed, no changes at this time  Nitrates PRN for chest pain   Hortencia Pilar, MD  08/09/2016 5:31 PM

## 2016-08-23 ENCOUNTER — Encounter (INDEPENDENT_AMBULATORY_CARE_PROVIDER_SITE_OTHER): Payer: Self-pay

## 2016-08-23 ENCOUNTER — Ambulatory Visit (INDEPENDENT_AMBULATORY_CARE_PROVIDER_SITE_OTHER): Payer: Self-pay | Admitting: Vascular Surgery

## 2017-02-07 ENCOUNTER — Encounter (INDEPENDENT_AMBULATORY_CARE_PROVIDER_SITE_OTHER): Payer: Medicare Other

## 2017-02-07 ENCOUNTER — Ambulatory Visit (INDEPENDENT_AMBULATORY_CARE_PROVIDER_SITE_OTHER): Payer: Medicare Other | Admitting: Vascular Surgery

## 2018-08-08 ENCOUNTER — Encounter: Payer: Self-pay | Admitting: *Deleted

## 2018-08-09 ENCOUNTER — Encounter: Payer: Self-pay | Admitting: Anesthesiology

## 2018-08-11 ENCOUNTER — Ambulatory Visit
Admission: RE | Admit: 2018-08-11 | Discharge: 2018-08-11 | Disposition: A | Discharge status: Home / Self Care | Payer: Medicare Other | Attending: Gastroenterology | Admitting: Gastroenterology

## 2018-08-11 ENCOUNTER — Ambulatory Visit: Payer: Medicare Other | Admitting: Anesthesiology

## 2018-08-11 ENCOUNTER — Encounter
Admission: RE | Disposition: A | Discharge status: Home / Self Care | Payer: Self-pay | Source: Home / Self Care | Attending: Gastroenterology

## 2018-08-11 ENCOUNTER — Encounter: Payer: Self-pay | Admitting: Gastroenterology

## 2018-08-11 DIAGNOSIS — I10 Essential (primary) hypertension: Secondary | ICD-10-CM | POA: Insufficient documentation

## 2018-08-11 DIAGNOSIS — I739 Peripheral vascular disease, unspecified: Secondary | ICD-10-CM | POA: Diagnosis not present

## 2018-08-11 DIAGNOSIS — Z86010 Personal history of colonic polyps: Secondary | ICD-10-CM | POA: Diagnosis present

## 2018-08-11 DIAGNOSIS — Z8673 Personal history of transient ischemic attack (TIA), and cerebral infarction without residual deficits: Secondary | ICD-10-CM | POA: Insufficient documentation

## 2018-08-11 DIAGNOSIS — Z87891 Personal history of nicotine dependence: Secondary | ICD-10-CM | POA: Diagnosis not present

## 2018-08-11 DIAGNOSIS — K5730 Diverticulosis of large intestine without perforation or abscess without bleeding: Secondary | ICD-10-CM | POA: Insufficient documentation

## 2018-08-11 DIAGNOSIS — K635 Polyp of colon: Secondary | ICD-10-CM | POA: Insufficient documentation

## 2018-08-11 DIAGNOSIS — Z7982 Long term (current) use of aspirin: Secondary | ICD-10-CM | POA: Insufficient documentation

## 2018-08-11 DIAGNOSIS — I2510 Atherosclerotic heart disease of native coronary artery without angina pectoris: Secondary | ICD-10-CM | POA: Diagnosis not present

## 2018-08-11 DIAGNOSIS — J449 Chronic obstructive pulmonary disease, unspecified: Secondary | ICD-10-CM | POA: Diagnosis not present

## 2018-08-11 DIAGNOSIS — E785 Hyperlipidemia, unspecified: Secondary | ICD-10-CM | POA: Insufficient documentation

## 2018-08-11 DIAGNOSIS — I350 Nonrheumatic aortic (valve) stenosis: Secondary | ICD-10-CM | POA: Insufficient documentation

## 2018-08-11 DIAGNOSIS — Z79899 Other long term (current) drug therapy: Secondary | ICD-10-CM | POA: Insufficient documentation

## 2018-08-11 DIAGNOSIS — D123 Benign neoplasm of transverse colon: Secondary | ICD-10-CM | POA: Diagnosis not present

## 2018-08-11 DIAGNOSIS — H409 Unspecified glaucoma: Secondary | ICD-10-CM | POA: Diagnosis not present

## 2018-08-11 DIAGNOSIS — D120 Benign neoplasm of cecum: Secondary | ICD-10-CM | POA: Insufficient documentation

## 2018-08-11 DIAGNOSIS — Z7902 Long term (current) use of antithrombotics/antiplatelets: Secondary | ICD-10-CM | POA: Diagnosis not present

## 2018-08-11 DIAGNOSIS — Z1211 Encounter for screening for malignant neoplasm of colon: Secondary | ICD-10-CM | POA: Diagnosis not present

## 2018-08-11 HISTORY — DX: Acidosis, unspecified: E87.20

## 2018-08-11 HISTORY — DX: Chronic obstructive pulmonary disease, unspecified: J44.9

## 2018-08-11 HISTORY — DX: Other cerebral infarction: I63.89

## 2018-08-11 HISTORY — DX: Acidosis: E87.2

## 2018-08-11 HISTORY — PX: COLONOSCOPY WITH PROPOFOL: SHX5780

## 2018-08-11 HISTORY — DX: Unspecified injury of head, initial encounter: S09.90XA

## 2018-08-11 SURGERY — COLONOSCOPY WITH PROPOFOL
Anesthesia: General

## 2018-08-11 MED ORDER — EPHEDRINE SULFATE 50 MG/ML IJ SOLN
INTRAMUSCULAR | Status: DC | PRN
  Administered 2018-08-11: 10 mg via INTRAVENOUS

## 2018-08-11 MED ORDER — PROPOFOL 10 MG/ML IV BOLUS
INTRAVENOUS | Status: DC | PRN
  Administered 2018-08-11: 10 mg via INTRAVENOUS
  Administered 2018-08-11: 30 mg via INTRAVENOUS
  Administered 2018-08-11: 20 mg via INTRAVENOUS
  Administered 2018-08-11: 40 mg via INTRAVENOUS

## 2018-08-11 MED ORDER — SODIUM CHLORIDE 0.9 % IV SOLN
INTRAVENOUS | Status: DC
  Administered 2018-08-11: 1000 mL via INTRAVENOUS

## 2018-08-11 MED ORDER — EPHEDRINE SULFATE 50 MG/ML IJ SOLN
INTRAMUSCULAR | Status: AC
  Filled 2018-08-11: qty 1

## 2018-08-11 MED ORDER — PROPOFOL 500 MG/50ML IV EMUL
INTRAVENOUS | Status: AC
  Filled 2018-08-11: qty 50

## 2018-08-11 MED ORDER — PHENYLEPHRINE HCL 10 MG/ML IJ SOLN
INTRAMUSCULAR | Status: DC | PRN
  Administered 2018-08-11 (×2): 100 ug via INTRAVENOUS
  Administered 2018-08-11: 150 ug via INTRAVENOUS

## 2018-08-11 MED ORDER — FENTANYL CITRATE (PF) 100 MCG/2ML IJ SOLN
INTRAMUSCULAR | Status: AC
  Filled 2018-08-11: qty 2

## 2018-08-11 MED ORDER — LIDOCAINE HCL (PF) 2 % IJ SOLN
INTRAMUSCULAR | Status: AC
  Filled 2018-08-11: qty 10

## 2018-08-11 MED ORDER — LIDOCAINE HCL (CARDIAC) PF 100 MG/5ML IV SOSY
PREFILLED_SYRINGE | INTRAVENOUS | Status: DC | PRN
  Administered 2018-08-11: 60 mg via INTRATRACHEAL

## 2018-08-11 MED ORDER — PROPOFOL 500 MG/50ML IV EMUL
INTRAVENOUS | Status: DC | PRN
  Administered 2018-08-11: 100 ug/kg/min via INTRAVENOUS

## 2018-08-11 MED ORDER — FENTANYL CITRATE (PF) 100 MCG/2ML IJ SOLN
INTRAMUSCULAR | Status: DC | PRN
  Administered 2018-08-11 (×2): 25 ug via INTRAVENOUS

## 2018-08-11 MED ORDER — PHENYLEPHRINE HCL 10 MG/ML IJ SOLN
INTRAMUSCULAR | Status: AC
  Filled 2018-08-11: qty 1

## 2018-08-11 MED ORDER — IPRATROPIUM-ALBUTEROL 0.5-2.5 (3) MG/3ML IN SOLN
RESPIRATORY_TRACT | Status: AC
  Administered 2018-08-11: 3 mL
  Filled 2018-08-11: qty 3

## 2018-08-11 MED ORDER — IPRATROPIUM-ALBUTEROL 0.5-2.5 (3) MG/3ML IN SOLN: 3.0000 mL | Freq: Four times a day (QID) | RESPIRATORY_TRACT | Status: DC

## 2018-08-11 NOTE — H&P (Signed)
Outpatient short stay form Pre-procedure 08/11/2018 7:34 AM [name] MD  Primary Physician: [name], MD  Reason for visit: Colonoscopy  History of present illness: Patient is a 63 year old male presenting today for colonoscopy.  He has personal history of adenomatous colon polyps.  Tolerated his prep well.  He takes 81 mg aspirin that has been held for at least several days.  He also was taking Plavix but stopped that about 2 to 3 months ago per patient.  Denies use of any other aspirin or NSAID with the exception of the [facility] powders which he takes for his leg discomfort.  The last time he took 1 of those was also about 5 or 6 days ago.  Patient has a  history of COPD and acute bronchitis.  He does take antiseizure medications.  He does have some mild aortic valve stenosis and aortic atherosclerosis.   No current facility-administered medications for this encounter.   Medications Prior to Admission  Medication Sig Dispense Refill Last Dose  . acetaZOLAMIDE (DIAMOX) 250 MG tablet Take 250 mg by mouth daily.    08/10/2018 at Unknown time  . albuterol (PROVENTIL HFA;VENTOLIN HFA) 108 (90 BASE) MCG/ACT inhaler Inhale 2 puffs into the lungs every 4 (four) hours as needed for wheezing or shortness of breath.   08/10/2018 at Unknown time  . amLODipine (NORVASC) 5 MG tablet Take 5 mg by mouth daily.   08/10/2018 at Unknown time  . aspirin EC 81 MG tablet Take 81 mg by mouth daily.   Past Week at Unknown time  . atorvastatin (LIPITOR) 20 MG tablet Take 20 mg by mouth daily.    08/10/2018 at Unknown time  . carbamazepine (TEGRETOL XR) 200 MG 12 hr tablet Take 200 mg by mouth 2 (two) times daily. Pt takes with a 400mg  tablet.   08/10/2018 at Unknown time  . carbamazepine (TEGRETOL XR) 400 MG 12 hr tablet Take 400 mg by mouth 2 (two) times daily. Pt takes with a 200mg  tablet.   08/10/2018 at Unknown time  . clopidogrel (PLAVIX) 75 MG tablet Take 75 mg by mouth daily.   Past Week at Unknown time  .  isosorbide mononitrate (IMDUR) 30 MG 24 hr tablet Take 30 mg by mouth daily.   08/10/2018 at Unknown time  . losartan (COZAAR) 100 MG tablet Take 100 mg by mouth daily.   08/10/2018 at Unknown time     No Known Allergies   Past Medical History:  Diagnosis Date  . Closed head injury   . COPD (chronic obstructive pulmonary disease) ([place])   . Coronary artery disease   . Glaucoma   . Hyperlipidemia   . Hypertension   . 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 Metabolic acidosis   . Peripheral vascular disease ([place])   . Pneumothorax   . Seizures ([place])     Review of systems:      Physical Exam    Heart and lungs: Regular rate and rhythm without rub or gallop, lungs sounds show a bilateral coarse rhonchi and wheezing.  Apparently no rales in the bases.    HEENT: Normocephalic atraumatic eyes are anicteric    Other:    Pertinant exam for procedure: Soft nontender nondistended bowel sounds positive normoactive.    Planned proceedures: Colonoscopy and indicated procedures. I have discussed the risks benefits and complications of procedures to include not limited to bleeding, infection, perforation and the risk of sedation and the patient wishes to proceed.  [name], MD Gastroenterology 08/11/2018  7:34 AM

## 2018-08-11 NOTE — Anesthesia Postprocedure Evaluation (Signed)
Anesthesia Post Note  Patient: Xavier Conley  Procedure(s) Performed: COLONOSCOPY WITH PROPOFOL (N/A )  Patient location during evaluation: Endoscopy Anesthesia Type: General Level of consciousness: awake and alert Pain management: pain level controlled Vital Signs Assessment: post-procedure vital signs reviewed and stable Respiratory status: spontaneous breathing, nonlabored ventilation, respiratory function stable and patient connected to nasal cannula oxygen Cardiovascular status: blood pressure returned to baseline and stable Postop Assessment: no apparent nausea or vomiting Anesthetic complications: no     Last Vitals:  Vitals:   08/11/18 0851 08/11/18 0901  BP: 131/62 127/71  Pulse: 61 61  Resp: 20 12  Temp:    SpO2: 100% 100%    Last Pain:  Vitals:   08/11/18 0901  TempSrc:   PainSc: 0-No pain                 Musto,Ksenia Kunz S

## 2018-08-11 NOTE — Anesthesia Preprocedure Evaluation (Signed)
Anesthesia Evaluation  Patient identified by MRN, date of birth, ID band Patient awake    Reviewed: Allergy & Precautions, NPO status , Patient's Chart, lab work & pertinent test results, reviewed documented beta blocker date and time   Airway Mallampati: II  TM Distance: >3 FB     Dental  (+) Chipped   Pulmonary COPD, former smoker,           Cardiovascular hypertension, Pt. on medications + CAD and + Peripheral Vascular Disease       Neuro/Psych Seizures -,     GI/Hepatic   Endo/Other    Renal/GU      Musculoskeletal   Abdominal   Peds  Hematology   Anesthesia Other Findings Hx intracranial hemorrhage. Lung repair. Smokes.  Reproductive/Obstetrics                             Anesthesia Physical Anesthesia Plan  ASA: III  Anesthesia Plan: General   Post-op Pain Management:    Induction: Intravenous  PONV Risk Score and Plan:   Airway Management Planned:   Additional Equipment:   Intra-op Plan:   Post-operative Plan:   Informed Consent: I have reviewed the patients History and Physical, chart, labs and discussed the procedure including the risks, benefits and alternatives for the proposed anesthesia with the patient or authorized representative who has indicated his/her understanding and acceptance.     Plan Discussed with: CRNA  Anesthesia Plan Comments:         Anesthesia Quick Evaluation

## 2018-08-11 NOTE — Anesthesia Post-op Follow-up Note (Signed)
Anesthesia QCDR form completed.        

## 2018-08-11 NOTE — Op Note (Signed)
[facility] Gastroenterology Patient Name: Xavier Conley Procedure Date: 08/11/2018 7:36 AM MRN: [id number] Account #: [id number] Date of Birth: [birth year] Admit Type: Outpatient Age: 63 Room: [facility] ENDO ROOM 3 Gender: Male Note Status: Finalized Procedure:            Colonoscopy Indications:          Personal history of colonic polyps Providers:            [name], MD Referring MD:         Ocie Cornfield. Ouida Sills MD, MD (Referring MD) Medicines:            Monitored Anesthesia Care Complications:        No immediate complications. Procedure:            Pre-Anesthesia Assessment:                       - ASA Grade Assessment: III - A patient with severe                        systemic disease.                       After obtaining informed consent, the colonoscope was                        passed under direct vision. Throughout the procedure,                        the patient's blood pressure, pulse, and oxygen                        saturations were monitored continuously. The was                        introduced through the anus and advanced to the the                        cecum, identified by appendiceal orifice and ileocecal                        valve. The colonoscopy was performed with moderate                        difficulty due to poor bowel prep. Successful                        completion of the procedure was aided by lavage. The                        quality of the bowel preparation was good except the                        ascending colon was poor. Findings:      A 2 mm polyp was found in the distal transverse colon. The polyp was       sessile. The polyp was removed with a cold biopsy forceps. Resection and       retrieval were complete.      Four sessile polyps were found in the cecum. The polyps were 1 to 2 mm  in size. These polyps were removed with a cold biopsy forceps. Resection       and retrieval were complete.      A  2 mm polyp was found in the distal descending colon. The polyp was       sessile. The polyp was removed with a cold biopsy forceps. Resection and       retrieval were complete.      A few small-mouthed diverticula were found in the sigmoid colon.      The retroflexed view of the distal rectum and anal verge was normal and       showed no anal or rectal abnormalities.      The digital rectal exam was normal. Impression:           - One 2 mm polyp in the distal transverse colon,                        removed with a cold biopsy forceps. Resected and                        retrieved.                       - Four 1 to 2 mm polyps in the cecum, removed with a                        cold biopsy forceps. Resected and retrieved.                       - One 2 mm polyp in the distal descending colon,                        removed with a cold biopsy forceps. Resected and                        retrieved.                       - Diverticulosis in the sigmoid colon.                       - The distal rectum and anal verge are normal on                        retroflexion view. Recommendation:       - Discharge patient to home.                       - Telephone GI clinic for pathology results in 1 week. [name], MD 08/11/2018 8:26:40 AM This report has been signed electronically. Number of Addenda: 0 Note Initiated On: 08/11/2018 7:36 AM Scope Withdrawal Time: 0 hours 12 minutes 38 seconds  Total Procedure Duration: 0 hours 20 minutes 12 seconds       [facility]

## 2018-08-11 NOTE — Anesthesia Postprocedure Evaluation (Signed)
Anesthesia Post Note  Patient: Xavier Conley  Procedure(s) Performed: COLONOSCOPY WITH PROPOFOL (N/A )  Anesthesia Type: General     Last Vitals:  Vitals:   08/11/18 0851 08/11/18 0901  BP: 131/62 127/71  Pulse: 61 61  Resp: 20 12  Temp:    SpO2: 100% 100%    Last Pain:  Vitals:   08/11/18 0901  TempSrc:   PainSc: 0-No pain                 Santillana,Riker Collier S

## 2018-08-11 NOTE — Transfer of Care (Signed)
Immediate Anesthesia Transfer of Care Note  Patient: Xavier Conley  Procedure(s) Performed: COLONOSCOPY WITH PROPOFOL (N/A )  Patient Location: Endoscopy Unit  Anesthesia Type:General  Level of Consciousness: drowsy  Airway & Oxygen Therapy: Patient Spontanous Breathing and Patient connected to nasal cannula oxygen  Post-op Assessment: Report given to RN and Post -op Vital signs reviewed and stable  Post vital signs: stable  Last Vitals:  Vitals Value Taken Time  BP 104/63 08/11/2018  8:31 AM  Temp 36 C 08/11/2018  8:31 AM  Pulse 60 08/11/2018  8:33 AM  Resp 21 08/11/2018  8:33 AM  SpO2 99 % 08/11/2018  8:33 AM  Vitals shown include unvalidated device data.  Last Pain:  Vitals:   08/11/18 0831  TempSrc: Tympanic  PainSc: Asleep         Complications: No apparent anesthesia complications

## 2018-08-13 LAB — SURGICAL PATHOLOGY

## 2019-10-29 ENCOUNTER — Telehealth: Payer: Self-pay | Admitting: *Deleted

## 2019-10-29 DIAGNOSIS — Z87891 Personal history of nicotine dependence: Secondary | ICD-10-CM

## 2019-10-29 NOTE — Telephone Encounter (Signed)
Received referral for initial lung cancer screening scan. PCP nurse contacted patient and obtained smoking history,(current, 100 pack year) as well as answering questions related to screening process. Patient denies signs of lung cancer such as weight loss or hemoptysis. Patient denies comorbidity that would prevent curative treatment if lung cancer were found. Patient is scheduled for shared decision making visit and CT scan on 11/03/19 at 130pm.

## 2019-11-03 ENCOUNTER — Other Ambulatory Visit: Payer: Self-pay

## 2019-11-03 ENCOUNTER — Inpatient Hospital Stay: Payer: Medicare Other | Attending: Nurse Practitioner | Admitting: Hospice and Palliative Medicine

## 2019-11-03 ENCOUNTER — Ambulatory Visit
Admission: RE | Admit: 2019-11-03 | Discharge: 2019-11-03 | Disposition: A | Discharge status: Home / Self Care | Payer: Medicare Other | Source: Ambulatory Visit | Attending: Nurse Practitioner | Admitting: Nurse Practitioner

## 2019-11-03 DIAGNOSIS — Z87891 Personal history of nicotine dependence: Secondary | ICD-10-CM

## 2019-11-03 NOTE — Progress Notes (Signed)
In accordance with CMS guidelines, patient has met eligibility criteria including age, absence of signs or symptoms of lung cancer.  Social History   Tobacco Use  . Smoking status: Former Smoker    Packs/day: 0.25    Years: 47.00    Pack years: 11.75    Types: Cigarettes  . Smokeless tobacco: Never Used  Substance Use Topics  . Alcohol use: No  . Drug use: No      A shared decision-making session was conducted prior to the performance of CT scan. This includes one or more decision aids, includes benefits and harms of screening, follow-up diagnostic testing, over-diagnosis, false positive rate, and total radiation exposure.   Counseling on the importance of adherence to annual lung cancer LDCT screening, impact of co-morbidities, and ability or willingness to undergo diagnosis and treatment is imperative for compliance of the program.   Counseling on the importance of continued smoking cessation for former smokers; the importance of smoking cessation for current smokers, and information about tobacco cessation interventions have been given to patient including [place] and 1800 quit [place] programs.   Written order for lung cancer screening with LDCT has been given to the patient and any and all questions have been answered to the best of my abilities.    Yearly follow up will be coordinated by Burgess Estelle, Thoracic Navigator.  Time Total: 15 minutes  Visit consisted of counseling and education dealing with complex health screening. Greater than 50%  of this time was spent counseling and coordinating care related to the above assessment and plan.  Signed by: Altha Harm, PhD, NP-C [phone] (Work Cell)

## 2019-11-05 ENCOUNTER — Ambulatory Visit: Payer: Medicare Other | Attending: Internal Medicine

## 2019-11-05 ENCOUNTER — Other Ambulatory Visit: Payer: Self-pay

## 2019-11-05 ENCOUNTER — Encounter: Payer: Self-pay | Admitting: *Deleted

## 2019-11-05 DIAGNOSIS — Z23 Encounter for immunization: Secondary | ICD-10-CM

## 2019-11-05 NOTE — Progress Notes (Signed)
   Covid-19 Vaccination Clinic  Name:  Xavier Conley    MRN: [id number] DOB: 08/17/1955  11/05/2019  Xavier Conley was observed post Covid-19 immunization for 15 minutes without incident. He was provided with Vaccine Information Sheet and instruction to access the V-Safe system.   Xavier Conley was instructed to call 911 with any severe reactions post vaccine: Marland Kitchen Difficulty breathing  . Swelling of face and throat  . A fast heartbeat  . A bad rash all over body  . Dizziness and weakness   Immunizations Administered    Name Date Dose VIS Date Route   Pfizer COVID-19 Vaccine 11/05/2019 11:13 AM 0.3 mL 07/17/2019 Intramuscular   Manufacturer: [place]   Lot: [id number]   [place]: [id number]

## 2019-12-01 ENCOUNTER — Ambulatory Visit: Payer: Medicare Other | Attending: Internal Medicine

## 2019-12-01 DIAGNOSIS — Z23 Encounter for immunization: Secondary | ICD-10-CM

## 2019-12-01 NOTE — Progress Notes (Signed)
   Covid-19 Vaccination Clinic  Name:  Xavier Conley    MRN: [id number] DOB: [birth year]  12/01/2019  [name] was observed post Covid-19 immunization for 15 minutes without incident. He was provided with Vaccine Information Sheet and instruction to access the V-Safe system.   [name] was instructed to call 911 with any severe reactions post vaccine: Marland Kitchen Difficulty breathing  . Swelling of face and throat  . A fast heartbeat  . A bad rash all over body  . Dizziness and weakness   Immunizations Administered    Name Date Dose VIS Date Route   Pfizer COVID-19 Vaccine 12/01/2019 10:10 AM 0.3 mL 09/30/2018 Intramuscular   Manufacturer: [employer]   Lot: [id number]   [place]: [id number]

## 2020-11-17 ENCOUNTER — Telehealth: Payer: Self-pay | Admitting: *Deleted

## 2020-11-17 ENCOUNTER — Other Ambulatory Visit: Payer: Self-pay | Admitting: Specialist

## 2020-11-17 DIAGNOSIS — R0600 Dyspnea, unspecified: Secondary | ICD-10-CM

## 2020-11-17 DIAGNOSIS — J849 Interstitial pulmonary disease, unspecified: Secondary | ICD-10-CM

## 2020-11-17 DIAGNOSIS — R0609 Other forms of dyspnea: Secondary | ICD-10-CM

## 2020-11-17 NOTE — Telephone Encounter (Signed)
Attempted to contact and schedule lung screening scan. Message left for patient to call back to schedule. 

## 2020-12-05 ENCOUNTER — Other Ambulatory Visit: Payer: Self-pay

## 2020-12-05 ENCOUNTER — Ambulatory Visit
Admission: RE | Admit: 2020-12-05 | Discharge: 2020-12-05 | Disposition: A | Discharge status: Home / Self Care | Payer: Medicare Other | Source: Ambulatory Visit | Attending: Specialist | Admitting: Specialist

## 2020-12-05 DIAGNOSIS — J849 Interstitial pulmonary disease, unspecified: Secondary | ICD-10-CM | POA: Insufficient documentation

## 2020-12-05 DIAGNOSIS — R0600 Dyspnea, unspecified: Secondary | ICD-10-CM | POA: Insufficient documentation

## 2020-12-05 DIAGNOSIS — R0609 Other forms of dyspnea: Secondary | ICD-10-CM

## 2021-12-15 ENCOUNTER — Other Ambulatory Visit: Payer: Self-pay | Admitting: Internal Medicine

## 2021-12-15 DIAGNOSIS — F17210 Nicotine dependence, cigarettes, uncomplicated: Secondary | ICD-10-CM

## 2021-12-25 ENCOUNTER — Ambulatory Visit
Admission: RE | Admit: 2021-12-25 | Discharge: 2021-12-25 | Disposition: A | Discharge status: Home / Self Care | Payer: Medicare Other | Source: Ambulatory Visit | Attending: Internal Medicine | Admitting: Internal Medicine

## 2021-12-25 DIAGNOSIS — F17210 Nicotine dependence, cigarettes, uncomplicated: Secondary | ICD-10-CM

## 2022-02-08 ENCOUNTER — Other Ambulatory Visit: Payer: Self-pay | Admitting: *Deleted

## 2022-02-08 DIAGNOSIS — Z87891 Personal history of nicotine dependence: Secondary | ICD-10-CM

## 2022-02-08 DIAGNOSIS — Z122 Encounter for screening for malignant neoplasm of respiratory organs: Secondary | ICD-10-CM

## 2022-02-08 DIAGNOSIS — F17210 Nicotine dependence, cigarettes, uncomplicated: Secondary | ICD-10-CM

## 2022-12-27 ENCOUNTER — Ambulatory Visit: Payer: Medicare Other

## 2023-01-09 ENCOUNTER — Other Ambulatory Visit: Payer: Self-pay

## 2023-01-09 DIAGNOSIS — J209 Acute bronchitis, unspecified: Secondary | ICD-10-CM

## 2023-01-09 DIAGNOSIS — Z720 Tobacco use: Secondary | ICD-10-CM

## 2023-01-09 DIAGNOSIS — F17210 Nicotine dependence, cigarettes, uncomplicated: Secondary | ICD-10-CM

## 2023-01-16 ENCOUNTER — Ambulatory Visit
Admission: RE | Admit: 2023-01-16 | Discharge: 2023-01-16 | Disposition: A | Discharge status: Home / Self Care | Payer: Medicare Other | Source: Ambulatory Visit | Attending: Internal Medicine | Admitting: Internal Medicine

## 2023-01-16 DIAGNOSIS — J209 Acute bronchitis, unspecified: Secondary | ICD-10-CM | POA: Diagnosis present

## 2023-01-16 DIAGNOSIS — F17210 Nicotine dependence, cigarettes, uncomplicated: Secondary | ICD-10-CM | POA: Insufficient documentation

## 2023-01-16 DIAGNOSIS — J440 Chronic obstructive pulmonary disease with acute lower respiratory infection: Secondary | ICD-10-CM | POA: Insufficient documentation

## 2023-01-16 DIAGNOSIS — Z720 Tobacco use: Secondary | ICD-10-CM | POA: Insufficient documentation

## 2023-03-25 IMAGING — CT CT CHEST LUNG CANCER SCREENING LOW DOSE W/O CM
1 of 2 series · 14 of 33 positions shown, 18 images · non-contrast
Comparison: 12/05/[address].

CLINICAL DATA: 66-year-old asymptomatic male current smoker with
102 pack-year smoking history.



[Series 4: chest 1mm/super d · axial · 0.79mm/px · z∈[-337,-38]mm · 14 of 409 slices shown, 18 images]
[im 18/409  mediastinal]
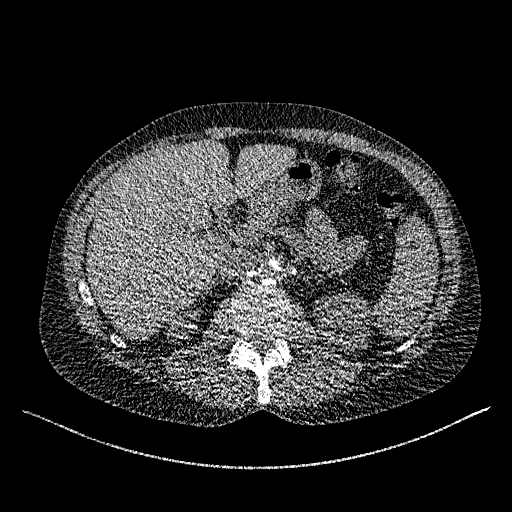
[im 18/409  lung]
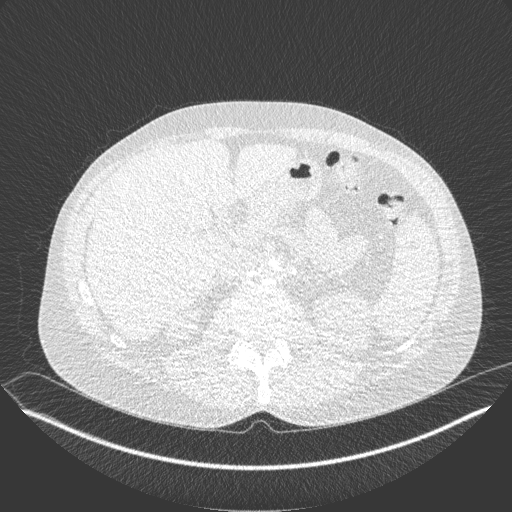
[im 54/409  lung]
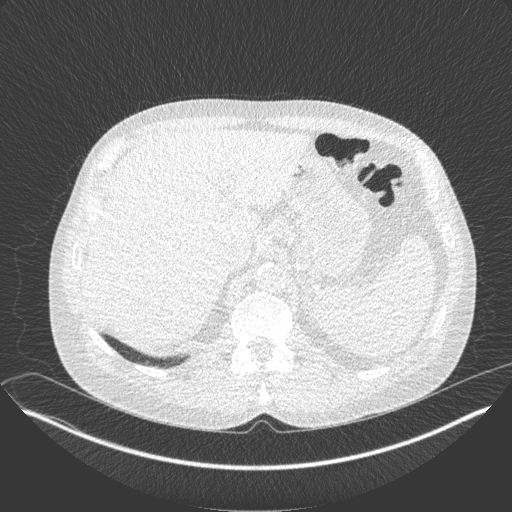
[im 89/409  lung]
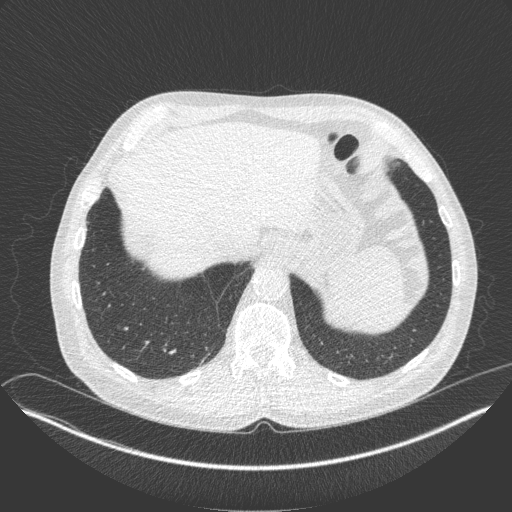
[im 107/409  lung]
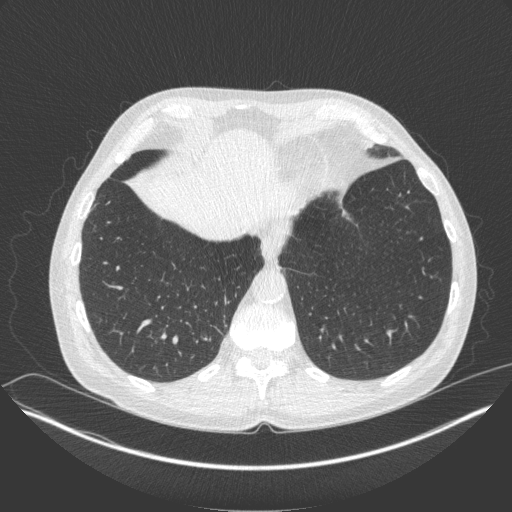
[im 142/409  mediastinal]
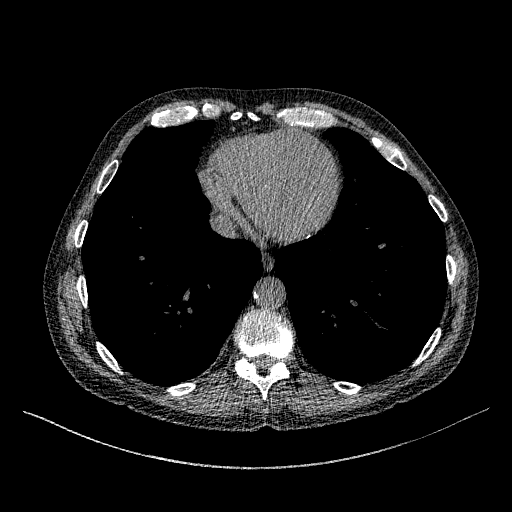
[im 142/409  lung]
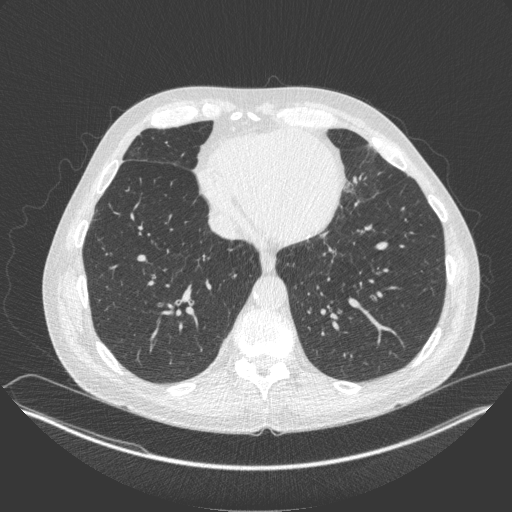
[im 178/409  lung]
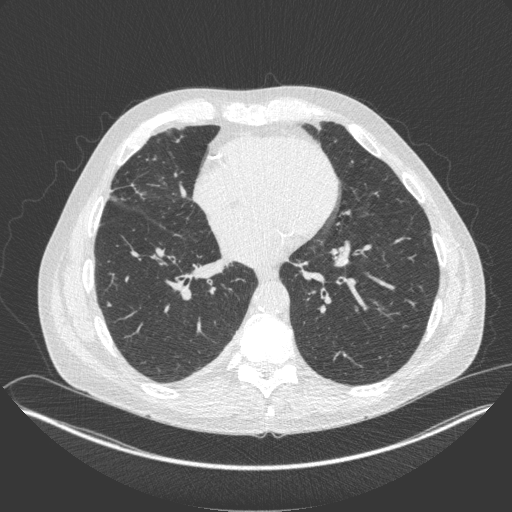
[im 196/409  lung]
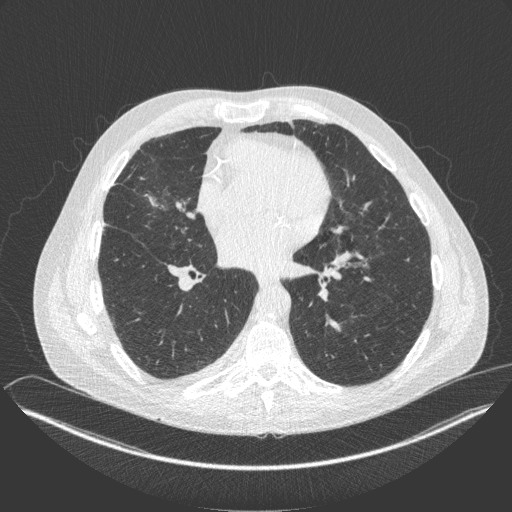
[im 205/409  lung]
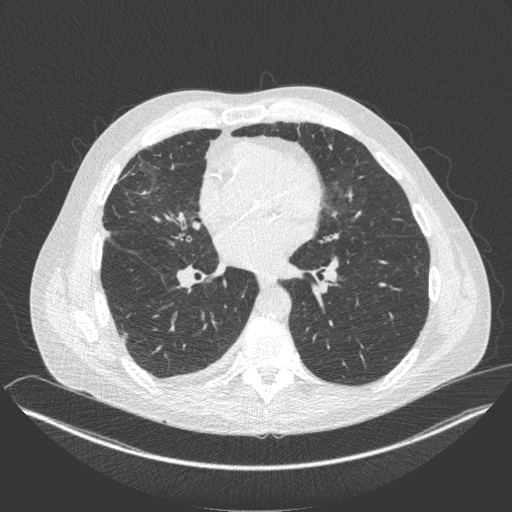
[im 231/409  mediastinal]
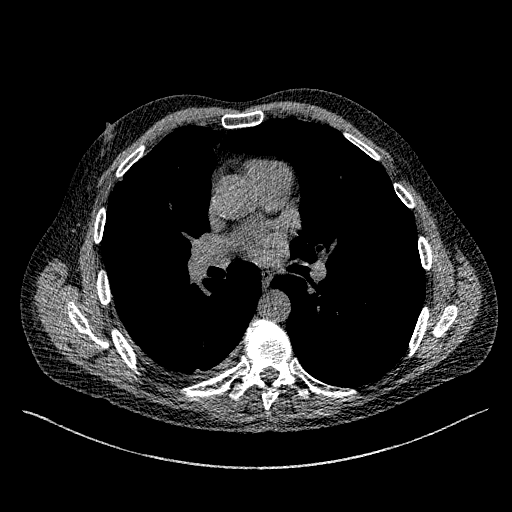
[im 231/409  lung]
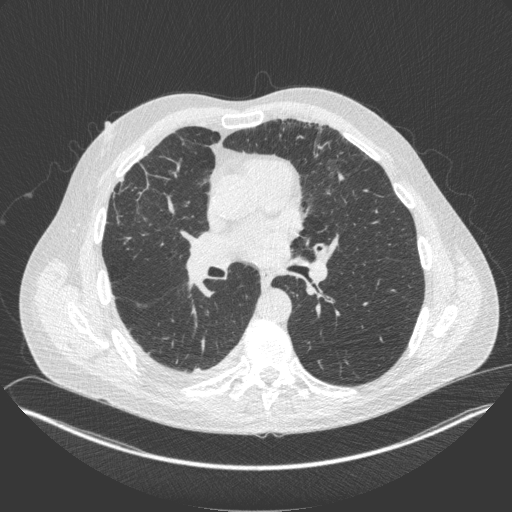
[im 267/409  lung]
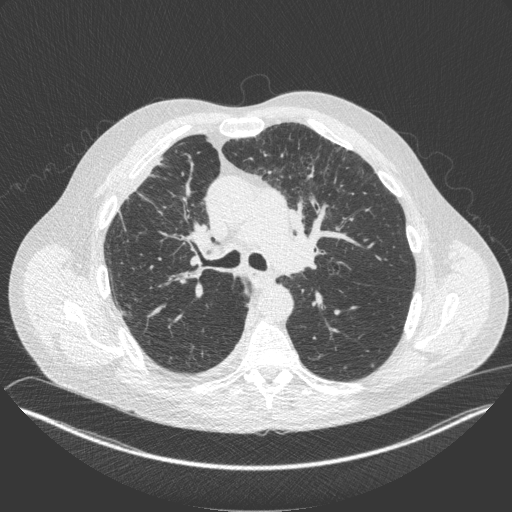
[im 302/409  lung]
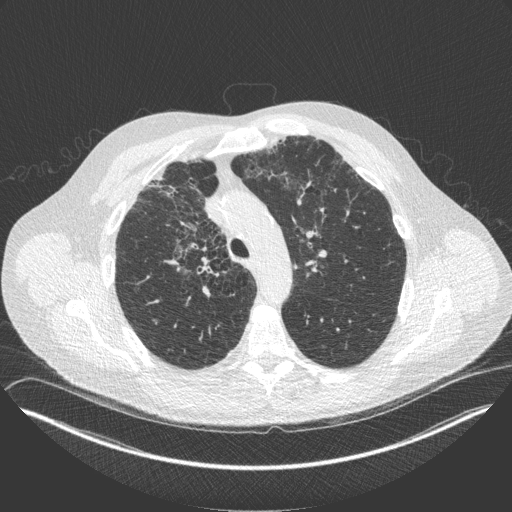
[im 320/409  lung]
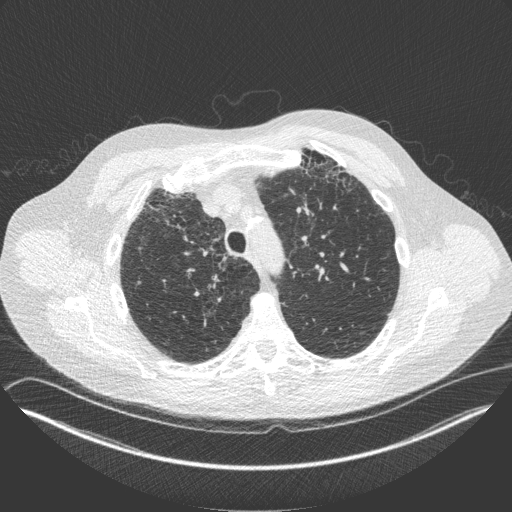
[im 355/409  mediastinal]
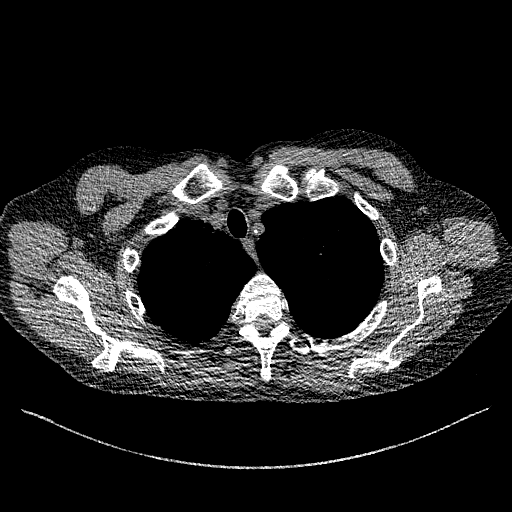
[im 355/409  lung]
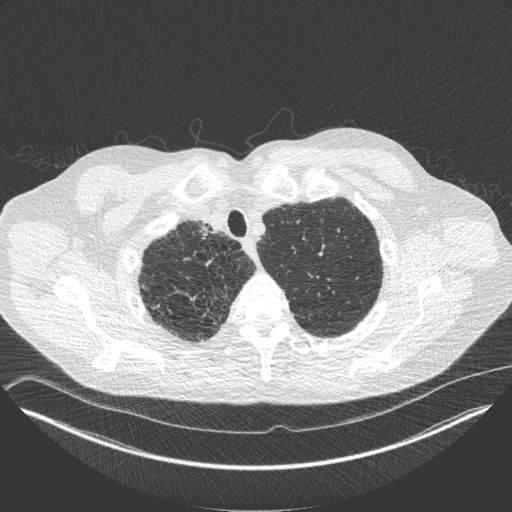
[im 391/409  lung]
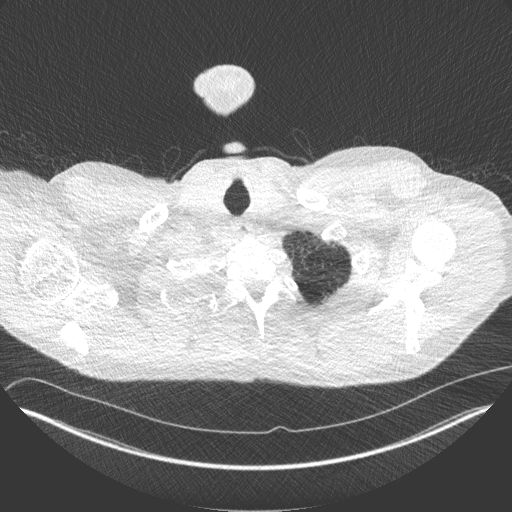

[14 of 33 positions shown; findings below may reference images not displayed]

FINDINGS: Cardiovascular: Normal heart size. No significant pericardial
effusion/thickening. Three-vessel coronary atherosclerosis.
Atherosclerotic nonaneurysmal thoracic aorta. Top-normal caliber
main pulmonary artery (3.3 cm diameter).

Mediastinum/Nodes: No discrete thyroid nodules. Unremarkable
esophagus. No pathologically enlarged axillary, mediastinal or hilar
lymph nodes, noting limited sensitivity for the detection of hilar
adenopathy on this noncontrast study.

Lungs/Pleura: No pneumothorax. Trace dependent right pleural
effusion, stable. No left pleural effusion. Moderate paraseptal and
centrilobular emphysema with diffuse bronchial wall thickening.
Nonspecific parenchymal banding and patchy peripheral reticulation
and ground-glass opacity in the upper lobes bilaterally, not
appreciably changed, favor mild smoking related interstitial lung
abnormality. No acute consolidative airspace disease or lung masses.
No significant growth of previously visualized pulmonary nodules. No
new significant pulmonary nodules.

Upper abdomen: Simple partially visualized 3.5 cm anterior upper
left renal cyst, for which no follow-up is recommended.

Musculoskeletal: No aggressive appearing focal osseous lesions.
Moderate thoracic spondylosis.
IMPRESSION: 1. Lung-RADS 2, benign appearance or behavior. Continue annual
screening with low-dose chest CT without contrast in 12 months.
2. Three-vessel coronary atherosclerosis.
3. Stable trace dependent right pleural effusion.
4. Aortic Atherosclerosis ([id number].S) and Emphysema ([id number].L).

## 2023-07-07 DEATH — deceased
